# Patient Record
Sex: Female | Born: 1950 | Race: White | Hispanic: No | Marital: Married | Smoking: Never smoker
Health system: Southern US, Community
[De-identification: ages and names within clinical notes are randomized; demographics above are authoritative.]

## PROBLEM LIST (undated history)

## (undated) DIAGNOSIS — R51 Headache: Secondary | ICD-10-CM

## (undated) DIAGNOSIS — C801 Malignant (primary) neoplasm, unspecified: Secondary | ICD-10-CM

## (undated) DIAGNOSIS — K289 Gastrojejunal ulcer, unspecified as acute or chronic, without hemorrhage or perforation: Secondary | ICD-10-CM

## (undated) DIAGNOSIS — Z9221 Personal history of antineoplastic chemotherapy: Secondary | ICD-10-CM

## (undated) DIAGNOSIS — Z923 Personal history of irradiation: Secondary | ICD-10-CM

## (undated) DIAGNOSIS — R112 Nausea with vomiting, unspecified: Secondary | ICD-10-CM

## (undated) DIAGNOSIS — Z9889 Other specified postprocedural states: Secondary | ICD-10-CM

## (undated) DIAGNOSIS — R5383 Other fatigue: Principal | ICD-10-CM

## (undated) DIAGNOSIS — C50919 Malignant neoplasm of unspecified site of unspecified female breast: Secondary | ICD-10-CM

## (undated) DIAGNOSIS — R011 Cardiac murmur, unspecified: Secondary | ICD-10-CM

## (undated) HISTORY — PX: BREAST LUMPECTOMY: SHX2

## (undated) HISTORY — PX: TUBAL LIGATION: SHX77

## (undated) HISTORY — DX: Malignant (primary) neoplasm, unspecified: C80.1

## (undated) HISTORY — PX: SHOULDER ARTHROSCOPY: SHX128

## (undated) HISTORY — DX: Other fatigue: R53.83

## (undated) HISTORY — DX: Malignant neoplasm of unspecified site of unspecified female breast: C50.919

## (undated) HISTORY — PX: APPENDECTOMY: SHX54

## (undated) HISTORY — DX: Gastrojejunal ulcer, unspecified as acute or chronic, without hemorrhage or perforation: K28.9

## (undated) HISTORY — PX: CARPAL TUNNEL RELEASE: SHX101

---

## 1990-12-01 HISTORY — PX: BREAST LUMPECTOMY: SHX2

## 1998-03-01 ENCOUNTER — Encounter: Admission: RE | Admit: 1998-03-01 | Discharge: 1998-05-30 | Payer: Self-pay | Admitting: Oncology

## 1999-10-10 ENCOUNTER — Encounter: Admission: RE | Admit: 1999-10-10 | Discharge: 1999-10-10 | Payer: Self-pay | Admitting: Oncology

## 1999-10-10 ENCOUNTER — Encounter (HOSPITAL_COMMUNITY): Payer: Self-pay | Admitting: Oncology

## 2000-10-12 ENCOUNTER — Encounter (HOSPITAL_COMMUNITY): Payer: Self-pay | Admitting: Oncology

## 2000-10-12 ENCOUNTER — Encounter: Admission: RE | Admit: 2000-10-12 | Discharge: 2000-10-12 | Payer: Self-pay | Admitting: Oncology

## 2001-10-14 ENCOUNTER — Encounter: Payer: Self-pay | Admitting: Internal Medicine

## 2001-10-14 ENCOUNTER — Encounter: Admission: RE | Admit: 2001-10-14 | Discharge: 2001-10-14 | Payer: Self-pay | Admitting: Internal Medicine

## 2001-10-21 ENCOUNTER — Encounter: Payer: Self-pay | Admitting: Internal Medicine

## 2001-10-21 ENCOUNTER — Encounter: Admission: RE | Admit: 2001-10-21 | Discharge: 2001-10-21 | Payer: Self-pay | Admitting: Internal Medicine

## 2002-10-31 ENCOUNTER — Encounter: Payer: Self-pay | Admitting: Internal Medicine

## 2002-10-31 ENCOUNTER — Encounter: Admission: RE | Admit: 2002-10-31 | Discharge: 2002-10-31 | Payer: Self-pay | Admitting: Internal Medicine

## 2003-12-11 ENCOUNTER — Encounter: Admission: RE | Admit: 2003-12-11 | Discharge: 2003-12-11 | Payer: Self-pay | Admitting: Internal Medicine

## 2004-12-26 ENCOUNTER — Encounter: Admission: RE | Admit: 2004-12-26 | Discharge: 2004-12-26 | Payer: Self-pay | Admitting: Internal Medicine

## 2005-12-29 ENCOUNTER — Encounter: Admission: RE | Admit: 2005-12-29 | Discharge: 2005-12-29 | Payer: Self-pay | Admitting: Internal Medicine

## 2006-12-30 ENCOUNTER — Encounter: Admission: RE | Admit: 2006-12-30 | Discharge: 2006-12-30 | Payer: Self-pay | Admitting: Internal Medicine

## 2007-01-11 ENCOUNTER — Encounter: Admission: RE | Admit: 2007-01-11 | Discharge: 2007-01-11 | Payer: Self-pay | Admitting: Internal Medicine

## 2008-02-09 ENCOUNTER — Encounter: Admission: RE | Admit: 2008-02-09 | Discharge: 2008-02-09 | Payer: Self-pay | Admitting: Internal Medicine

## 2009-02-13 ENCOUNTER — Encounter: Admission: RE | Admit: 2009-02-13 | Discharge: 2009-02-13 | Payer: Self-pay | Admitting: Internal Medicine

## 2009-08-24 ENCOUNTER — Encounter: Payer: Self-pay | Admitting: Internal Medicine

## 2009-09-19 ENCOUNTER — Ambulatory Visit: Payer: Self-pay | Admitting: Internal Medicine

## 2009-09-19 DIAGNOSIS — R930 Abnormal findings on diagnostic imaging of skull and head, not elsewhere classified: Secondary | ICD-10-CM | POA: Insufficient documentation

## 2009-09-19 DIAGNOSIS — R079 Chest pain, unspecified: Secondary | ICD-10-CM

## 2009-09-19 DIAGNOSIS — C50511 Malignant neoplasm of lower-outer quadrant of right female breast: Secondary | ICD-10-CM | POA: Insufficient documentation

## 2009-09-19 HISTORY — DX: Chest pain, unspecified: R07.9

## 2009-10-29 ENCOUNTER — Ambulatory Visit: Payer: Self-pay | Admitting: Internal Medicine

## 2010-02-18 ENCOUNTER — Encounter: Admission: RE | Admit: 2010-02-18 | Discharge: 2010-02-18 | Payer: Self-pay | Admitting: Internal Medicine

## 2010-12-01 HISTORY — PX: BREAST LUMPECTOMY: SHX2

## 2010-12-22 ENCOUNTER — Encounter: Payer: Self-pay | Admitting: Internal Medicine

## 2011-03-10 ENCOUNTER — Other Ambulatory Visit: Payer: Self-pay | Admitting: Internal Medicine

## 2011-03-10 DIAGNOSIS — Z1231 Encounter for screening mammogram for malignant neoplasm of breast: Secondary | ICD-10-CM

## 2011-03-12 ENCOUNTER — Ambulatory Visit
Admission: RE | Admit: 2011-03-12 | Discharge: 2011-03-12 | Disposition: A | Discharge status: Home / Self Care | Payer: BC Managed Care – PPO | Source: Ambulatory Visit | Attending: Internal Medicine | Admitting: Internal Medicine

## 2011-03-12 DIAGNOSIS — Z1231 Encounter for screening mammogram for malignant neoplasm of breast: Secondary | ICD-10-CM

## 2011-03-17 ENCOUNTER — Other Ambulatory Visit: Payer: Self-pay | Admitting: Internal Medicine

## 2011-03-17 DIAGNOSIS — R928 Other abnormal and inconclusive findings on diagnostic imaging of breast: Secondary | ICD-10-CM

## 2011-03-20 ENCOUNTER — Other Ambulatory Visit: Payer: BC Managed Care – PPO

## 2011-03-24 ENCOUNTER — Other Ambulatory Visit: Payer: BC Managed Care – PPO

## 2011-03-25 ENCOUNTER — Ambulatory Visit
Admission: RE | Admit: 2011-03-25 | Discharge: 2011-03-25 | Disposition: A | Discharge status: Home / Self Care | Payer: BC Managed Care – PPO | Source: Ambulatory Visit | Attending: Internal Medicine | Admitting: Internal Medicine

## 2011-03-25 ENCOUNTER — Other Ambulatory Visit: Payer: Self-pay | Admitting: Radiology

## 2011-03-25 ENCOUNTER — Other Ambulatory Visit: Payer: Self-pay | Admitting: Internal Medicine

## 2011-03-25 DIAGNOSIS — R928 Other abnormal and inconclusive findings on diagnostic imaging of breast: Secondary | ICD-10-CM

## 2011-03-25 HISTORY — PX: BREAST BIOPSY: SHX20

## 2011-03-26 ENCOUNTER — Other Ambulatory Visit: Payer: Self-pay | Admitting: Internal Medicine

## 2011-03-26 DIAGNOSIS — C50911 Malignant neoplasm of unspecified site of right female breast: Secondary | ICD-10-CM

## 2011-03-31 ENCOUNTER — Ambulatory Visit
Admission: RE | Admit: 2011-03-31 | Discharge: 2011-03-31 | Disposition: A | Discharge status: Home / Self Care | Payer: BC Managed Care – PPO | Source: Ambulatory Visit | Attending: Internal Medicine | Admitting: Internal Medicine

## 2011-03-31 DIAGNOSIS — C50911 Malignant neoplasm of unspecified site of right female breast: Secondary | ICD-10-CM

## 2011-03-31 MED ORDER — GADOBENATE DIMEGLUMINE 529 MG/ML IV SOLN
14.0000 mL | Freq: Once | INTRAVENOUS | Status: AC | PRN
  Administered 2011-03-31: 14 mL via INTRAVENOUS

## 2011-04-02 ENCOUNTER — Other Ambulatory Visit: Payer: Self-pay | Admitting: Oncology

## 2011-04-02 ENCOUNTER — Encounter (HOSPITAL_BASED_OUTPATIENT_CLINIC_OR_DEPARTMENT_OTHER): Payer: BC Managed Care – PPO | Admitting: Oncology

## 2011-04-02 DIAGNOSIS — C50419 Malignant neoplasm of upper-outer quadrant of unspecified female breast: Secondary | ICD-10-CM

## 2011-04-02 LAB — CBC WITH DIFFERENTIAL/PLATELET
BASO%: 0.6 % (ref 0.0–2.0)
Basophils Absolute: 0 10*3/uL (ref 0.0–0.1)
HCT: 36.8 % (ref 34.8–46.6)
HGB: 12.5 g/dL (ref 11.6–15.9)
MCH: 30.4 pg (ref 25.1–34.0)
MCV: 89.3 fL (ref 79.5–101.0)
MONO%: 6.5 % (ref 0.0–14.0)
NEUT#: 3 10*3/uL (ref 1.5–6.5)
Platelets: 209 10*3/uL (ref 145–400)
RBC: 4.12 10*6/uL (ref 3.70–5.45)
RDW: 13.1 % (ref 11.2–14.5)

## 2011-04-02 LAB — COMPREHENSIVE METABOLIC PANEL
ALT: 22 U/L (ref 0–35)
AST: 23 U/L (ref 0–37)
Alkaline Phosphatase: 90 U/L (ref 39–117)
BUN: 16 mg/dL (ref 6–23)
Calcium: 9.6 mg/dL (ref 8.4–10.5)
Creatinine, Ser: 0.87 mg/dL (ref 0.40–1.20)
Sodium: 140 mEq/L (ref 135–145)
Total Protein: 6.8 g/dL (ref 6.0–8.3)

## 2011-04-07 ENCOUNTER — Encounter: Payer: BC Managed Care – PPO | Admitting: Genetic Counselor

## 2011-04-21 ENCOUNTER — Encounter (HOSPITAL_BASED_OUTPATIENT_CLINIC_OR_DEPARTMENT_OTHER): Payer: BC Managed Care – PPO | Admitting: Oncology

## 2011-04-21 DIAGNOSIS — C50419 Malignant neoplasm of upper-outer quadrant of unspecified female breast: Secondary | ICD-10-CM

## 2011-04-22 ENCOUNTER — Other Ambulatory Visit: Payer: Self-pay | Admitting: General Surgery

## 2011-04-22 DIAGNOSIS — C50911 Malignant neoplasm of unspecified site of right female breast: Secondary | ICD-10-CM

## 2011-04-23 ENCOUNTER — Other Ambulatory Visit (HOSPITAL_COMMUNITY): Payer: Self-pay | Admitting: General Surgery

## 2011-04-23 DIAGNOSIS — C50911 Malignant neoplasm of unspecified site of right female breast: Secondary | ICD-10-CM

## 2011-05-02 ENCOUNTER — Other Ambulatory Visit (HOSPITAL_COMMUNITY): Payer: Self-pay | Admitting: General Surgery

## 2011-05-02 ENCOUNTER — Ambulatory Visit (HOSPITAL_COMMUNITY)
Admission: RE | Admit: 2011-05-02 | Discharge: 2011-05-02 | Disposition: A | Discharge status: Home / Self Care | Payer: BC Managed Care – PPO | Source: Ambulatory Visit | Attending: General Surgery | Admitting: General Surgery

## 2011-05-02 ENCOUNTER — Encounter (HOSPITAL_COMMUNITY)
Admission: RE | Admit: 2011-05-02 | Discharge: 2011-05-02 | Disposition: A | Discharge status: Home / Self Care | Payer: BC Managed Care – PPO | Source: Ambulatory Visit | Attending: General Surgery | Admitting: General Surgery

## 2011-05-02 DIAGNOSIS — Z01812 Encounter for preprocedural laboratory examination: Secondary | ICD-10-CM | POA: Insufficient documentation

## 2011-05-02 DIAGNOSIS — C50911 Malignant neoplasm of unspecified site of right female breast: Secondary | ICD-10-CM

## 2011-05-02 DIAGNOSIS — C50919 Malignant neoplasm of unspecified site of unspecified female breast: Secondary | ICD-10-CM | POA: Insufficient documentation

## 2011-05-02 DIAGNOSIS — Z01818 Encounter for other preprocedural examination: Secondary | ICD-10-CM | POA: Insufficient documentation

## 2011-05-02 LAB — CBC
HCT: 39 % (ref 36.0–46.0)
MCHC: 33.8 g/dL (ref 30.0–36.0)
MCV: 89 fL (ref 78.0–100.0)
Platelets: 231 10*3/uL (ref 150–400)
RDW: 12.5 % (ref 11.5–15.5)
WBC: 7.3 10*3/uL (ref 4.0–10.5)

## 2011-05-02 LAB — SURGICAL PCR SCREEN: Staphylococcus aureus: NEGATIVE

## 2011-05-02 LAB — DIFFERENTIAL
Basophils Absolute: 0 10*3/uL (ref 0.0–0.1)
Eosinophils Absolute: 0.1 10*3/uL (ref 0.0–0.7)
Eosinophils Relative: 2 % (ref 0–5)
Lymphs Abs: 1.9 10*3/uL (ref 0.7–4.0)
Monocytes Absolute: 0.5 10*3/uL (ref 0.1–1.0)
Neutrophils Relative %: 65 % (ref 43–77)

## 2011-05-02 LAB — COMPREHENSIVE METABOLIC PANEL
Alkaline Phosphatase: 156 U/L — ABNORMAL HIGH (ref 39–117)
CO2: 28 mEq/L (ref 19–32)
Chloride: 102 mEq/L (ref 96–112)
GFR calc non Af Amer: >60 mL/min (ref >60)
Glucose, Bld: 118 mg/dL — ABNORMAL HIGH (ref 70–99)
Sodium: 138 mEq/L (ref 135–145)
Total Bilirubin: 0.5 mg/dL (ref 0.3–1.2)

## 2011-05-08 ENCOUNTER — Ambulatory Visit
Admission: RE | Admit: 2011-05-08 | Discharge: 2011-05-08 | Disposition: A | Discharge status: Home / Self Care | Payer: BC Managed Care – PPO | Source: Ambulatory Visit | Attending: General Surgery | Admitting: General Surgery

## 2011-05-08 ENCOUNTER — Other Ambulatory Visit: Payer: Self-pay | Admitting: General Surgery

## 2011-05-08 ENCOUNTER — Observation Stay (HOSPITAL_COMMUNITY)
Admission: RE | Admit: 2011-05-08 | Discharge: 2011-05-09 | Disposition: A | Discharge status: Home / Self Care | Payer: BC Managed Care – PPO | Source: Ambulatory Visit | Attending: General Surgery | Admitting: General Surgery

## 2011-05-08 ENCOUNTER — Ambulatory Visit (HOSPITAL_COMMUNITY)
Admission: RE | Admit: 2011-05-08 | Discharge: 2011-05-08 | Disposition: A | Discharge status: Home / Self Care | Payer: BC Managed Care – PPO | Source: Ambulatory Visit | Attending: General Surgery | Admitting: General Surgery

## 2011-05-08 DIAGNOSIS — C50419 Malignant neoplasm of upper-outer quadrant of unspecified female breast: Principal | ICD-10-CM | POA: Insufficient documentation

## 2011-05-08 DIAGNOSIS — C50911 Malignant neoplasm of unspecified site of right female breast: Secondary | ICD-10-CM

## 2011-05-08 MED ORDER — TECHNETIUM TC 99M SULFUR COLLOID FILTERED
1.0000 | Freq: Once | INTRAVENOUS | Status: AC | PRN
  Administered 2011-05-08: 1 via INTRADERMAL

## 2011-05-15 NOTE — Op Note (Signed)
NAME:  Grace Carroll, Grace Carroll                   ACCOUNT NO.:  [id number]  MEDICAL RECORD NO.:  [id number]  LOCATION:  XRAY                         FACILITY:  [facility]  PHYSICIAN:  Adolph Pollack, M.D.DATE OF BIRTH:  May 01, 1951  DATE OF PROCEDURE:  05/07/2011 DATE OF DISCHARGE:  05/02/2011                              OPERATIVE REPORT   PREOPERATIVE DIAGNOSIS:  Right breast cancer.  POSTOPERATIVE DIAGNOSIS:  Right breast cancer.  PROCEDURES: 1. Right breast injection with blue dye and lymphatic mapping. 2. Right axillary sentinel lymph node biopsy. 3. Right partial mastectomy after needle localization.  SURGEON:  Adolph Pollack, MD  ANESTHESIA:  General plus Marcaine local.  INDICATIONS:  Grace Carroll is a 60-year female who 21 years ago had invasive left breast cancer and underwent breast conservation therapy. She underwent a screening mammogram, was noted have an abnormal lesion at 10 o'clock position in the right breast which biopsy was positive for invasive cancer.  She has chosen once again breast conservation therapy and now presents for the above procedures.  TECHNIQUE:  She had successful needle localization and injection of radioactive material in the circumareolar area.  Right breast was marked with my initials in the holding room.  She was brought to the operating, placed supine on the operating table, and general anesthetic was administered.  The bandage of right breast was removed and the wire cut closer to the skin.  The right breast wire and axillary area were sterilely prepped and draped.  Using a NeoProbe, I found an area of increased counts in the lower aspect of the right axilla.  A transverse incision was made in the lower right axillary region through skin and subcutaneous tissue.  Using a NeoProbe, it guided me down to where I was able to identify an enlarged lymph node.  I excised this and looked like a bilobed lymph node with increased counts.  It was not  blue, but it was hot and it was sent to pathology.  I placed a NeoProbe back into the axillary area and no other increased counts were noted.  I then subsequently injected the wound with local anesthetic.  Bleeding was controlled with electrocautery.  Once hemostasis was adequate, I closed the subcutaneous tissue with interrupted 3-0 Vicryl suture.  The skin was closed with 4-0 Monocryl subcuticular stitch.  Next, I approached the wire which was at lateral position of the breast. I made a curvilinear incision in lateral aspect of breast through skin and subcutaneous tissue and brought the wire into the wound.  Using electrocautery, I raised recent flaps in directions.  I then basically did a lumpectomy around the wire down to the chest wall medially and somewhat posteriorly.  Once this tissue was removed, it was and was marked with the wire being lateral, single suture being anterior, double suture being superior.  A specimen mammogram was sent and the area of concern was contained and confirmed by the radiologist.  Following this, I then injected local anesthetic into the wound.  I inspected the wound and hemostasis was adequate.  I irrigated the wound and evacuated the fluid.  I then closed subcutaneous tissue with interrupted 3-0 Vicryl  sutures. The skin was closed with 4-0 Monocryl subcuticular stitch.  Steri-Strips and sterile dressings were applied to both wounds.  She tolerated the procedures well without any apparent complications and was taken to recovery in satisfactory condition.     Adolph Pollack, M.D.     Grace Carroll  D:  05/08/2011  T:  05/09/2011  Job:  [id number]  cc:   Grace Sender., MD Grace Carroll, M.D. Grace Carroll, M.D.  Electronically Signed by Avel Peace M.D. on 05/15/2011 10:33:09 AM

## 2011-05-19 ENCOUNTER — Other Ambulatory Visit: Payer: Self-pay | Admitting: Oncology

## 2011-05-19 ENCOUNTER — Encounter (HOSPITAL_BASED_OUTPATIENT_CLINIC_OR_DEPARTMENT_OTHER): Payer: BC Managed Care – PPO | Admitting: Oncology

## 2011-05-19 DIAGNOSIS — G4700 Insomnia, unspecified: Secondary | ICD-10-CM

## 2011-05-19 DIAGNOSIS — Z170 Estrogen receptor positive status [ER+]: Secondary | ICD-10-CM

## 2011-05-19 DIAGNOSIS — C50419 Malignant neoplasm of upper-outer quadrant of unspecified female breast: Secondary | ICD-10-CM

## 2011-05-19 DIAGNOSIS — F411 Generalized anxiety disorder: Secondary | ICD-10-CM

## 2011-05-19 LAB — CBC WITH DIFFERENTIAL/PLATELET
BASO%: 0.9 % (ref 0.0–2.0)
Basophils Absolute: 0.1 10*3/uL (ref 0.0–0.1)
EOS%: 5.1 % (ref 0.0–7.0)
HCT: 38.4 % (ref 34.8–46.6)
HGB: 13 g/dL (ref 11.6–15.9)
LYMPH%: 32.1 % (ref 14.0–49.7)
MCH: 30.2 pg (ref 25.1–34.0)
MCHC: 34 g/dL (ref 31.5–36.0)
MCV: 88.9 fL (ref 79.5–101.0)
NEUT%: 55.5 % (ref 38.4–76.8)
Platelets: 222 10*3/uL (ref 145–400)

## 2011-05-19 LAB — COMPREHENSIVE METABOLIC PANEL
ALT: 17 U/L (ref 0–35)
AST: 17 U/L (ref 0–37)
BUN: 15 mg/dL (ref 6–23)
Calcium: 9.4 mg/dL (ref 8.4–10.5)
Chloride: 101 mEq/L (ref 96–112)
Creatinine, Ser: 0.81 mg/dL (ref 0.50–1.10)
Total Bilirubin: 0.5 mg/dL (ref 0.3–1.2)

## 2011-05-28 ENCOUNTER — Encounter (INDEPENDENT_AMBULATORY_CARE_PROVIDER_SITE_OTHER): Payer: Self-pay | Admitting: General Surgery

## 2011-05-28 ENCOUNTER — Ambulatory Visit (INDEPENDENT_AMBULATORY_CARE_PROVIDER_SITE_OTHER): Payer: BC Managed Care – PPO | Admitting: General Surgery

## 2011-05-28 DIAGNOSIS — C773 Secondary and unspecified malignant neoplasm of axilla and upper limb lymph nodes: Secondary | ICD-10-CM

## 2011-05-28 DIAGNOSIS — C50919 Malignant neoplasm of unspecified site of unspecified female breast: Secondary | ICD-10-CM

## 2011-05-28 NOTE — Progress Notes (Signed)
Subjective:     Patient ID: Grace Carroll, female   DOB: 1951-10-14, 60 y.o.   MRN: [id number]    There were no vitals taken for this visit.    HPI She is here for postoperative visit following her right breast lumpectomy and sentinel lymph node biopsy. A sentinel lymph node was positive for metastatic carcinoma. Pathology is T2 N1, estrogen receptor positive. The inferior margin is very close. She was presented at breast cancer conference, it was recommended that she have reexcision. She also is going to need chemotherapy, thus she needs long-term venous access. She is sore at the incision site.  Review of Systems     Objective:   Physical Exam Right breast: Incision is clean and intact. A soft seroma is present. Right axilla incision clean and intact. There is hyperemia around the nipple.    Assessment:     T2N1 right breast cancer. Close inferior margin.    Plan:     Reexcision of right breast lumpectomy site. Insertion of Port-A-Cath. Procedure and risks have been discussed with her. Risks include but not limited to bleeding, infection, anesthesia, wound healing problems, pneumothorax, catheter malfunction, DVT. She seems to understand this and agrees with the plan.

## 2011-05-29 ENCOUNTER — Encounter (INDEPENDENT_AMBULATORY_CARE_PROVIDER_SITE_OTHER): Payer: BC Managed Care – PPO | Admitting: General Surgery

## 2011-06-03 ENCOUNTER — Other Ambulatory Visit (INDEPENDENT_AMBULATORY_CARE_PROVIDER_SITE_OTHER): Payer: Self-pay | Admitting: General Surgery

## 2011-06-03 ENCOUNTER — Encounter (HOSPITAL_COMMUNITY): Payer: BC Managed Care – PPO

## 2011-06-03 ENCOUNTER — Encounter (HOSPITAL_BASED_OUTPATIENT_CLINIC_OR_DEPARTMENT_OTHER): Payer: BC Managed Care – PPO | Admitting: Oncology

## 2011-06-03 DIAGNOSIS — C50419 Malignant neoplasm of upper-outer quadrant of unspecified female breast: Secondary | ICD-10-CM

## 2011-06-03 DIAGNOSIS — Z170 Estrogen receptor positive status [ER+]: Secondary | ICD-10-CM

## 2011-06-03 LAB — SURGICAL PCR SCREEN: Staphylococcus aureus: NEGATIVE

## 2011-06-03 LAB — CBC
HCT: 40.8 % (ref 36.0–46.0)
Hemoglobin: 13.5 g/dL (ref 12.0–15.0)
MCHC: 33.1 g/dL (ref 30.0–36.0)
MCV: 87.9 fL (ref 78.0–100.0)
RDW: 12.5 % (ref 11.5–15.5)
WBC: 5.3 10*3/uL (ref 4.0–10.5)

## 2011-06-09 ENCOUNTER — Ambulatory Visit (HOSPITAL_COMMUNITY): Payer: BC Managed Care – PPO

## 2011-06-09 ENCOUNTER — Other Ambulatory Visit (INDEPENDENT_AMBULATORY_CARE_PROVIDER_SITE_OTHER): Payer: Self-pay | Admitting: General Surgery

## 2011-06-09 ENCOUNTER — Ambulatory Visit (HOSPITAL_COMMUNITY)
Admission: RE | Admit: 2011-06-09 | Discharge: 2011-06-09 | Disposition: A | Discharge status: Home / Self Care | Payer: BC Managed Care – PPO | Source: Ambulatory Visit | Attending: General Surgery | Admitting: General Surgery

## 2011-06-09 DIAGNOSIS — C50919 Malignant neoplasm of unspecified site of unspecified female breast: Secondary | ICD-10-CM | POA: Insufficient documentation

## 2011-06-09 DIAGNOSIS — N6039 Fibrosclerosis of unspecified breast: Secondary | ICD-10-CM | POA: Insufficient documentation

## 2011-06-09 DIAGNOSIS — Z01812 Encounter for preprocedural laboratory examination: Secondary | ICD-10-CM | POA: Insufficient documentation

## 2011-06-09 DIAGNOSIS — K219 Gastro-esophageal reflux disease without esophagitis: Secondary | ICD-10-CM | POA: Insufficient documentation

## 2011-06-09 LAB — GLUCOSE, CAPILLARY: Glucose-Capillary: 97 mg/dL (ref 70–99)

## 2011-06-17 NOTE — Op Note (Signed)
NAME:  Grace Carroll, MESSENGER                   ACCOUNT NO.:  [id number]  MEDICAL RECORD NO.:  [id number]  LOCATION:  DAYL                         FACILITY:  [facility]  PHYSICIAN:  Adolph Pollack, M.D.DATE OF BIRTH:  1951/11/14  DATE OF PROCEDURE:  06/09/2011 DATE OF DISCHARGE:  06/09/2011                              OPERATIVE REPORT   PREOPERATIVE DIAGNOSIS:  Right breast cancer with close inferior margin.  POSTOPERATIVE DIAGNOSIS:  Right breast cancer with close inferior margin.  PROCEDURES: 1. Ultrasound-guided Port-A-Cath insertion with fluoroscopy. 2. Re-excision of right breast lumpectomy site.  SURGEON:  Adolph Pollack, M.D.  ANESTHESIA:  General plus Marcaine local.  INDICATIONS:  Grace Carroll is a 60 year old female who underwent a right lumpectomy after needle localization as well as right axillary sentinel lymph node biopsy.  On the right lumpectomy, the inferior margin was very close.  She also was going to need some chemotherapy.  She thus presents for the above procedures.  TECHNIQUE:  She was seen in the holding area and the right breast marked with my initials.  She was then brought to the operating room, placed supine on the operating room table and general anesthetic was administered.  The chest wall neck was sterilely prepped and draped.  A roll was placed under her back.  Her head was tilted to the left. Using ultrasound, I identified the right internal jugular vein.  A small incision was made in the neck and a 16-gauge needle initially placed into right internal jugular but went through the back wall into the carotid artery and I retracted this and held pressure.  Subsequently I was able to cannulate the right internal jugular vein under ultrasound guidance and thread the wire into the superior vena cava.  I verified its position by way of fluoroscopy.  I then went through a previous Port- A-Cath incision site in the right chest wall.  I anesthetized this  area and reincised the scar and created a pocket for the port using electrocautery.  The catheter was then tunneled from the inferior incision up to the neck incision.  A dilator introducer complex was placed over the wire and position verified by fluoroscopy.  The dilator and wire were removed and the catheter was threaded through the peel- away sheath introducer.  Under fluoroscopy, I pulled the catheter back to the tip in the distal superior vena cava.  I subsequently then cut the catheter and attached the port at the chest wall incision.  The port was then attached to the chest wall with interrupted 2-0 Vicryl sutures. The port aspirated blood and flushed easily and I put concentrated heparin solution in the port.  Hemostasis was adequate.  I closed the subcutaneous tissue over the port with a running 2-0 Vicryl suture.  The skin of both incisions was closed with 4-0 Monocryl subcuticular stitch.  Steri-Strips and sterile dressing were applied.  Next, I approached the right lateral breast where I reincised the previous lumpectomy scar.  I went through subcutaneous tissue as well going through the sutures.  I then identified the inferior margin area and using electrocautery I excised the specimen off an inferior margin tissue and sent  this to Pathology.  I then controlled bleeding with electrocautery.  Once hemostasis was adequate, I irrigated the wound and evacuated the solution by way of suction.  I then approximated the subcutaneous tissues with 3-0 Vicryl suture.  The skin was closed with 4- 0 Monocryl subcuticular stitch.  Steri-Strips and sterile dressing were applied.  She tolerated the procedure well without any apparent complications and was taken to recovery room in satisfactory condition where portable chest x-ray is pending.     Adolph Pollack, M.D.     Kari Baars  D:  06/09/2011  T:  06/09/2011  Job:  [id number]  cc:   Drue Second, M.D. Fax:  [phone]  Maryln Gottron, M.D. Fax: [phone]  Wannetta Sender., MD Fax: [phone]  Electronically Signed by Avel Peace M.D. on 06/17/2011 09:49:26 AM

## 2011-06-23 ENCOUNTER — Telehealth (INDEPENDENT_AMBULATORY_CARE_PROVIDER_SITE_OTHER): Payer: Self-pay

## 2011-06-23 NOTE — Telephone Encounter (Deleted)
Pt called this morning c/o of a "sloshing" sound in her breast.  She has no pain or fever.  I explained that this can happen after surgery, and usually the body will absorb the excess fluid over time.  I told her if she became uncomfortable to let us know.  Pt agreed.

## 2011-06-26 ENCOUNTER — Other Ambulatory Visit: Payer: Self-pay | Admitting: Oncology

## 2011-06-26 ENCOUNTER — Encounter (HOSPITAL_BASED_OUTPATIENT_CLINIC_OR_DEPARTMENT_OTHER): Payer: BC Managed Care – PPO | Admitting: Oncology

## 2011-06-26 DIAGNOSIS — C50419 Malignant neoplasm of upper-outer quadrant of unspecified female breast: Secondary | ICD-10-CM

## 2011-06-26 DIAGNOSIS — Z5111 Encounter for antineoplastic chemotherapy: Secondary | ICD-10-CM

## 2011-06-26 LAB — COMPREHENSIVE METABOLIC PANEL
ALT: 40 U/L — ABNORMAL HIGH (ref 0–35)
AST: 30 U/L (ref 0–37)
Albumin: 4.3 g/dL (ref 3.5–5.2)
CO2: 23 mEq/L (ref 19–32)
Calcium: 9.4 mg/dL (ref 8.4–10.5)
Chloride: 103 mEq/L (ref 96–112)
Creatinine, Ser: 0.86 mg/dL (ref 0.50–1.10)
Potassium: 4.1 mEq/L (ref 3.5–5.3)
Sodium: 141 mEq/L (ref 135–145)
Total Protein: 6.3 g/dL (ref 6.0–8.3)

## 2011-06-26 LAB — CBC WITH DIFFERENTIAL/PLATELET
BASO%: 0.7 % (ref 0.0–2.0)
Eosinophils Absolute: 0.2 10*3/uL (ref 0.0–0.5)
MCHC: 33.9 g/dL (ref 31.5–36.0)
MONO#: 0.5 10*3/uL (ref 0.1–0.9)
NEUT#: 4 10*3/uL (ref 1.5–6.5)
RBC: 4.26 10*6/uL (ref 3.70–5.45)
RDW: 13 % (ref 11.2–14.5)
WBC: 6.8 10*3/uL (ref 3.9–10.3)
lymph#: 2 10*3/uL (ref 0.9–3.3)
nRBC: 0 % (ref 0–0)

## 2011-06-27 ENCOUNTER — Encounter (HOSPITAL_BASED_OUTPATIENT_CLINIC_OR_DEPARTMENT_OTHER): Payer: BC Managed Care – PPO | Admitting: Oncology

## 2011-07-02 ENCOUNTER — Other Ambulatory Visit: Payer: Self-pay | Admitting: Oncology

## 2011-07-02 ENCOUNTER — Encounter (HOSPITAL_BASED_OUTPATIENT_CLINIC_OR_DEPARTMENT_OTHER): Payer: BC Managed Care – PPO

## 2011-07-02 DIAGNOSIS — C50419 Malignant neoplasm of upper-outer quadrant of unspecified female breast: Secondary | ICD-10-CM

## 2011-07-02 DIAGNOSIS — Z170 Estrogen receptor positive status [ER+]: Secondary | ICD-10-CM

## 2011-07-02 LAB — CBC WITH DIFFERENTIAL (CANCER CENTER ONLY)
BASO%: 0.9 % (ref 0.0–2.0)
EOS%: 4.7 % (ref 0.0–7.0)
HCT: 38.4 % (ref 34.8–46.6)
LYMPH#: 1.2 10*3/uL (ref 0.9–3.3)
MCHC: 34.9 g/dL (ref 32.0–36.0)
NEUT%: 39.7 % (ref 39.6–80.0)
Platelets: 178 10*3/uL (ref 145–400)
RDW: 12.5 % (ref 11.1–15.7)

## 2011-07-02 LAB — BASIC METABOLIC PANEL
Calcium: 10 mg/dL (ref 8.4–10.5)
Chloride: 96 mEq/L (ref 96–112)
Creatinine, Ser: 0.89 mg/dL (ref 0.50–1.10)
Sodium: 136 mEq/L (ref 135–145)

## 2011-07-04 ENCOUNTER — Encounter (INDEPENDENT_AMBULATORY_CARE_PROVIDER_SITE_OTHER): Payer: Self-pay | Admitting: General Surgery

## 2011-07-04 ENCOUNTER — Ambulatory Visit (INDEPENDENT_AMBULATORY_CARE_PROVIDER_SITE_OTHER): Payer: BC Managed Care – PPO | Admitting: General Surgery

## 2011-07-04 DIAGNOSIS — C50919 Malignant neoplasm of unspecified site of unspecified female breast: Secondary | ICD-10-CM

## 2011-07-04 NOTE — Progress Notes (Signed)
Operation:  Right partial mastectomy, right axillary sentinel lymph node biopsy. Reexcision right partial mastectomy site, Port-A-Cath insertion.  Date:  May 07, 2011. Second operation June 09, 2011.  Pathology: T2 N1a  HPI:  She is here for a postoperative visit after the above procedures. She is tolerating her chemotherapy fairly well. No problems with her incisions.   Physical Exam: Right breast-upper outer quadrant incision is clean, intact, and solid. Right chest wall Port-A-Cath incision is clean. Right neck incision is clean. Right axillary incision is clean, intact.   Assessment:  T2 N1 right breast cancer-wounds healing well.  Plan:  Return visit in 3-4 months.

## 2011-07-17 ENCOUNTER — Other Ambulatory Visit: Payer: Self-pay | Admitting: Oncology

## 2011-07-17 ENCOUNTER — Encounter (HOSPITAL_BASED_OUTPATIENT_CLINIC_OR_DEPARTMENT_OTHER): Payer: BC Managed Care – PPO | Admitting: Oncology

## 2011-07-17 DIAGNOSIS — Z5111 Encounter for antineoplastic chemotherapy: Secondary | ICD-10-CM

## 2011-07-17 DIAGNOSIS — C50419 Malignant neoplasm of upper-outer quadrant of unspecified female breast: Secondary | ICD-10-CM

## 2011-07-17 DIAGNOSIS — Z170 Estrogen receptor positive status [ER+]: Secondary | ICD-10-CM

## 2011-07-17 LAB — CBC WITH DIFFERENTIAL/PLATELET
Basophils Absolute: 0.1 10*3/uL (ref 0.0–0.1)
HCT: 35.1 % (ref 34.8–46.6)
HGB: 11.8 g/dL (ref 11.6–15.9)
LYMPH%: 23.5 % (ref 14.0–49.7)
MCH: 29.8 pg (ref 25.1–34.0)
MONO#: 0.5 10*3/uL (ref 0.1–0.9)
NEUT%: 64.3 % (ref 38.4–76.8)
Platelets: 281 10*3/uL (ref 145–400)
WBC: 5.6 10*3/uL (ref 3.9–10.3)
lymph#: 1.3 10*3/uL (ref 0.9–3.3)

## 2011-07-17 LAB — COMPREHENSIVE METABOLIC PANEL
Albumin: 4.2 g/dL (ref 3.5–5.2)
BUN: 18 mg/dL (ref 6–23)
Calcium: 9.4 mg/dL (ref 8.4–10.5)
Chloride: 105 mEq/L (ref 96–112)
Creatinine, Ser: 1.01 mg/dL (ref 0.50–1.10)
Glucose, Bld: 82 mg/dL (ref 70–99)
Potassium: 4.3 mEq/L (ref 3.5–5.3)

## 2011-07-18 ENCOUNTER — Encounter (HOSPITAL_BASED_OUTPATIENT_CLINIC_OR_DEPARTMENT_OTHER): Payer: BC Managed Care – PPO | Admitting: Oncology

## 2011-07-18 DIAGNOSIS — C50419 Malignant neoplasm of upper-outer quadrant of unspecified female breast: Secondary | ICD-10-CM

## 2011-07-18 DIAGNOSIS — Z5189 Encounter for other specified aftercare: Secondary | ICD-10-CM

## 2011-07-24 NOTE — Telephone Encounter (Signed)
Message unavailable.

## 2011-07-25 ENCOUNTER — Encounter (HOSPITAL_BASED_OUTPATIENT_CLINIC_OR_DEPARTMENT_OTHER): Payer: BC Managed Care – PPO | Admitting: Oncology

## 2011-07-25 ENCOUNTER — Other Ambulatory Visit: Payer: Self-pay | Admitting: Oncology

## 2011-07-25 DIAGNOSIS — C50419 Malignant neoplasm of upper-outer quadrant of unspecified female breast: Secondary | ICD-10-CM

## 2011-07-25 DIAGNOSIS — Z170 Estrogen receptor positive status [ER+]: Secondary | ICD-10-CM

## 2011-07-25 DIAGNOSIS — E860 Dehydration: Secondary | ICD-10-CM

## 2011-07-25 LAB — CBC WITH DIFFERENTIAL/PLATELET
Basophils Absolute: 0 10*3/uL (ref 0.0–0.1)
EOS%: 0.7 % (ref 0.0–7.0)
Eosinophils Absolute: 0.1 10*3/uL (ref 0.0–0.5)
HGB: 12.2 g/dL (ref 11.6–15.9)
MONO#: 0.5 10*3/uL (ref 0.1–0.9)
NEUT#: 11.3 10*3/uL — ABNORMAL HIGH (ref 1.5–6.5)
RDW: 14.3 % (ref 11.2–14.5)
WBC: 13.7 10*3/uL — ABNORMAL HIGH (ref 3.9–10.3)
lymph#: 1.8 10*3/uL (ref 0.9–3.3)

## 2011-07-25 LAB — BASIC METABOLIC PANEL
BUN: 13 mg/dL (ref 6–23)
CO2: 27 mEq/L (ref 19–32)
Chloride: 99 mEq/L (ref 96–112)
Potassium: 4.5 mEq/L (ref 3.5–5.3)

## 2011-07-26 ENCOUNTER — Encounter (HOSPITAL_BASED_OUTPATIENT_CLINIC_OR_DEPARTMENT_OTHER): Payer: BC Managed Care – PPO | Admitting: Oncology

## 2011-07-26 DIAGNOSIS — Z170 Estrogen receptor positive status [ER+]: Secondary | ICD-10-CM

## 2011-07-26 DIAGNOSIS — E860 Dehydration: Secondary | ICD-10-CM

## 2011-08-07 ENCOUNTER — Encounter (HOSPITAL_BASED_OUTPATIENT_CLINIC_OR_DEPARTMENT_OTHER): Payer: BC Managed Care – PPO | Admitting: Oncology

## 2011-08-07 ENCOUNTER — Other Ambulatory Visit: Payer: Self-pay | Admitting: Oncology

## 2011-08-07 DIAGNOSIS — Z5111 Encounter for antineoplastic chemotherapy: Secondary | ICD-10-CM

## 2011-08-07 DIAGNOSIS — C50419 Malignant neoplasm of upper-outer quadrant of unspecified female breast: Secondary | ICD-10-CM

## 2011-08-07 DIAGNOSIS — Z170 Estrogen receptor positive status [ER+]: Secondary | ICD-10-CM

## 2011-08-07 LAB — CBC WITH DIFFERENTIAL/PLATELET
Basophils Absolute: 0 10*3/uL (ref 0.0–0.1)
Eosinophils Absolute: 0 10*3/uL (ref 0.0–0.5)
LYMPH%: 6.5 % — ABNORMAL LOW (ref 14.0–49.7)
MCV: 89.2 fL (ref 79.5–101.0)
MONO%: 1.5 % (ref 0.0–14.0)
NEUT#: 8.9 10*3/uL — ABNORMAL HIGH (ref 1.5–6.5)
Platelets: 253 10*3/uL (ref 145–400)
RBC: 4.06 10*6/uL (ref 3.70–5.45)
nRBC: 0 % (ref 0–0)

## 2011-08-07 LAB — COMPREHENSIVE METABOLIC PANEL
CO2: 21 mEq/L (ref 19–32)
Creatinine, Ser: 0.8 mg/dL (ref 0.50–1.10)
Glucose, Bld: 189 mg/dL — ABNORMAL HIGH (ref 70–99)
Total Bilirubin: 0.5 mg/dL (ref 0.3–1.2)

## 2011-08-08 ENCOUNTER — Encounter (HOSPITAL_BASED_OUTPATIENT_CLINIC_OR_DEPARTMENT_OTHER): Payer: BC Managed Care – PPO | Admitting: Oncology

## 2011-08-08 DIAGNOSIS — Z5189 Encounter for other specified aftercare: Secondary | ICD-10-CM

## 2011-08-08 DIAGNOSIS — C50419 Malignant neoplasm of upper-outer quadrant of unspecified female breast: Secondary | ICD-10-CM

## 2011-08-14 ENCOUNTER — Encounter (HOSPITAL_COMMUNITY): Payer: BC Managed Care – PPO | Attending: Oncology

## 2011-08-14 ENCOUNTER — Other Ambulatory Visit: Payer: Self-pay | Admitting: Oncology

## 2011-08-14 ENCOUNTER — Encounter (HOSPITAL_BASED_OUTPATIENT_CLINIC_OR_DEPARTMENT_OTHER): Payer: BC Managed Care – PPO | Admitting: Oncology

## 2011-08-14 DIAGNOSIS — C50419 Malignant neoplasm of upper-outer quadrant of unspecified female breast: Secondary | ICD-10-CM

## 2011-08-14 DIAGNOSIS — G4700 Insomnia, unspecified: Secondary | ICD-10-CM

## 2011-08-14 DIAGNOSIS — F411 Generalized anxiety disorder: Secondary | ICD-10-CM

## 2011-08-14 DIAGNOSIS — R112 Nausea with vomiting, unspecified: Secondary | ICD-10-CM

## 2011-08-14 DIAGNOSIS — C50919 Malignant neoplasm of unspecified site of unspecified female breast: Secondary | ICD-10-CM | POA: Insufficient documentation

## 2011-08-14 LAB — CBC WITH DIFFERENTIAL/PLATELET
Basophils Absolute: 0 10*3/uL (ref 0.0–0.1)
EOS%: 1.9 % (ref 0.0–7.0)
HGB: 11.9 g/dL (ref 11.6–15.9)
MCH: 31.3 pg (ref 25.1–34.0)
MCV: 90.9 fL (ref 79.5–101.0)
MONO%: 11.4 % (ref 0.0–14.0)
RDW: 15.4 % — ABNORMAL HIGH (ref 11.2–14.5)

## 2011-08-14 LAB — BASIC METABOLIC PANEL
BUN: 10 mg/dL (ref 6–23)
Creatinine, Ser: 1 mg/dL (ref 0.50–1.10)
Potassium: 4.6 mEq/L (ref 3.5–5.3)

## 2011-08-15 ENCOUNTER — Encounter (HOSPITAL_COMMUNITY): Payer: BC Managed Care – PPO

## 2011-08-21 ENCOUNTER — Ambulatory Visit
Admission: RE | Admit: 2011-08-21 | Discharge: 2011-08-21 | Disposition: A | Discharge status: Home / Self Care | Payer: BC Managed Care – PPO | Source: Ambulatory Visit | Attending: Radiation Oncology | Admitting: Radiation Oncology

## 2011-08-21 DIAGNOSIS — C50919 Malignant neoplasm of unspecified site of unspecified female breast: Secondary | ICD-10-CM | POA: Insufficient documentation

## 2011-08-21 DIAGNOSIS — Z170 Estrogen receptor positive status [ER+]: Secondary | ICD-10-CM | POA: Insufficient documentation

## 2011-08-21 DIAGNOSIS — C773 Secondary and unspecified malignant neoplasm of axilla and upper limb lymph nodes: Secondary | ICD-10-CM | POA: Insufficient documentation

## 2011-08-21 DIAGNOSIS — Z510 Encounter for antineoplastic radiation therapy: Secondary | ICD-10-CM | POA: Insufficient documentation

## 2011-08-22 ENCOUNTER — Other Ambulatory Visit: Payer: Self-pay | Admitting: Oncology

## 2011-08-22 ENCOUNTER — Encounter (HOSPITAL_BASED_OUTPATIENT_CLINIC_OR_DEPARTMENT_OTHER): Payer: BC Managed Care – PPO | Admitting: Oncology

## 2011-08-22 DIAGNOSIS — F411 Generalized anxiety disorder: Secondary | ICD-10-CM

## 2011-08-22 DIAGNOSIS — G4700 Insomnia, unspecified: Secondary | ICD-10-CM

## 2011-08-22 DIAGNOSIS — Z170 Estrogen receptor positive status [ER+]: Secondary | ICD-10-CM

## 2011-08-22 DIAGNOSIS — C50419 Malignant neoplasm of upper-outer quadrant of unspecified female breast: Secondary | ICD-10-CM

## 2011-08-22 LAB — CBC WITH DIFFERENTIAL/PLATELET
Eosinophils Absolute: 0 10*3/uL (ref 0.0–0.5)
MONO#: 0.5 10*3/uL (ref 0.1–0.9)
NEUT#: 16.3 10*3/uL — ABNORMAL HIGH (ref 1.5–6.5)
Platelets: 185 10*3/uL (ref 145–400)
RBC: 4.17 10*6/uL (ref 3.70–5.45)
RDW: 16.3 % — ABNORMAL HIGH (ref 11.2–14.5)
WBC: 18.5 10*3/uL — ABNORMAL HIGH (ref 3.9–10.3)
lymph#: 1.6 10*3/uL (ref 0.9–3.3)

## 2011-08-22 LAB — BASIC METABOLIC PANEL
CO2: 25 mEq/L (ref 19–32)
Chloride: 101 mEq/L (ref 96–112)
Glucose, Bld: 98 mg/dL (ref 70–99)
Potassium: 3.9 mEq/L (ref 3.5–5.3)
Sodium: 139 mEq/L (ref 135–145)

## 2011-08-28 ENCOUNTER — Other Ambulatory Visit: Payer: Self-pay | Admitting: Oncology

## 2011-08-28 ENCOUNTER — Encounter (HOSPITAL_BASED_OUTPATIENT_CLINIC_OR_DEPARTMENT_OTHER): Payer: BC Managed Care – PPO | Admitting: Oncology

## 2011-08-28 DIAGNOSIS — C50419 Malignant neoplasm of upper-outer quadrant of unspecified female breast: Secondary | ICD-10-CM

## 2011-08-28 DIAGNOSIS — Z5111 Encounter for antineoplastic chemotherapy: Secondary | ICD-10-CM

## 2011-08-28 LAB — CBC WITH DIFFERENTIAL/PLATELET
Basophils Absolute: 0 10*3/uL (ref 0.0–0.1)
Eosinophils Absolute: 0 10*3/uL (ref 0.0–0.5)
HGB: 12.9 g/dL (ref 11.6–15.9)
MONO#: 0.3 10*3/uL (ref 0.1–0.9)
NEUT#: 13.4 10*3/uL — ABNORMAL HIGH (ref 1.5–6.5)
RBC: 4.26 10*6/uL (ref 3.70–5.45)
RDW: 15.6 % — ABNORMAL HIGH (ref 11.2–14.5)
WBC: 14.3 10*3/uL — ABNORMAL HIGH (ref 3.9–10.3)
nRBC: 0 % (ref 0–0)

## 2011-08-28 LAB — COMPREHENSIVE METABOLIC PANEL
ALT: 12 U/L (ref 0–35)
Albumin: 4 g/dL (ref 3.5–5.2)
CO2: 23 mEq/L (ref 19–32)
Calcium: 8.9 mg/dL (ref 8.4–10.5)
Chloride: 103 mEq/L (ref 96–112)
Glucose, Bld: 155 mg/dL — ABNORMAL HIGH (ref 70–99)
Potassium: 4.5 mEq/L (ref 3.5–5.3)
Sodium: 140 mEq/L (ref 135–145)
Total Protein: 6.1 g/dL (ref 6.0–8.3)

## 2011-09-04 ENCOUNTER — Other Ambulatory Visit: Payer: Self-pay | Admitting: Oncology

## 2011-09-04 ENCOUNTER — Encounter (HOSPITAL_BASED_OUTPATIENT_CLINIC_OR_DEPARTMENT_OTHER): Payer: BC Managed Care – PPO | Admitting: Oncology

## 2011-09-04 DIAGNOSIS — C50419 Malignant neoplasm of upper-outer quadrant of unspecified female breast: Secondary | ICD-10-CM

## 2011-09-04 DIAGNOSIS — Z170 Estrogen receptor positive status [ER+]: Secondary | ICD-10-CM

## 2011-09-04 DIAGNOSIS — D72819 Decreased white blood cell count, unspecified: Secondary | ICD-10-CM

## 2011-09-04 DIAGNOSIS — I959 Hypotension, unspecified: Secondary | ICD-10-CM

## 2011-09-04 LAB — MANUAL DIFFERENTIAL
ALC: 0.3 10*3/uL — ABNORMAL LOW (ref 0.9–3.3)
ANC (CHCC manual diff): 0.5 10*3/uL — ABNORMAL LOW (ref 1.5–6.5)
EOS: 6 % (ref 0–7)
PLT EST: ADEQUATE
SEG: 46 % (ref 38–77)

## 2011-09-04 LAB — BASIC METABOLIC PANEL
CO2: 24 mEq/L (ref 19–32)
Calcium: 8.7 mg/dL (ref 8.4–10.5)
Potassium: 4.1 mEq/L (ref 3.5–5.3)
Sodium: 137 mEq/L (ref 135–145)

## 2011-09-04 LAB — CBC WITH DIFFERENTIAL/PLATELET
MCV: 89.4 fL (ref 79.5–101.0)
RBC: 3.77 10*6/uL (ref 3.70–5.45)
RDW: 14.6 % — ABNORMAL HIGH (ref 11.2–14.5)
WBC: 0.9 10*3/uL — CL (ref 3.9–10.3)

## 2011-09-05 ENCOUNTER — Encounter (HOSPITAL_BASED_OUTPATIENT_CLINIC_OR_DEPARTMENT_OTHER): Payer: BC Managed Care – PPO | Admitting: Oncology

## 2011-09-05 DIAGNOSIS — C50419 Malignant neoplasm of upper-outer quadrant of unspecified female breast: Secondary | ICD-10-CM

## 2011-09-05 DIAGNOSIS — I959 Hypotension, unspecified: Secondary | ICD-10-CM

## 2011-09-29 ENCOUNTER — Encounter (INDEPENDENT_AMBULATORY_CARE_PROVIDER_SITE_OTHER): Payer: Self-pay | Admitting: General Surgery

## 2011-09-30 ENCOUNTER — Encounter: Payer: Self-pay | Admitting: *Deleted

## 2011-10-06 ENCOUNTER — Ambulatory Visit
Admission: RE | Admit: 2011-10-06 | Discharge: 2011-10-06 | Disposition: A | Discharge status: Home / Self Care | Payer: BC Managed Care – PPO | Source: Ambulatory Visit | Attending: Radiation Oncology | Admitting: Radiation Oncology

## 2011-10-06 DIAGNOSIS — C50919 Malignant neoplasm of unspecified site of unspecified female breast: Secondary | ICD-10-CM

## 2011-10-06 NOTE — Progress Notes (Signed)
[facility] Rad[facility]cology Weekly Treatment Note    Name: Grace Carroll Date: 10/06/2011 MRN: [id number] DOB: 11-17-1951  Status:outpatient    Current dose: 3600cGy  Current fraction:20  Planned dose:6040cGy  Planned fraction:33   MEDICATIONS:@MEDADMINPROSE @   ALLERGIES: Codeine   LABORATORY DATA:     NARRATIVE: Grace Carroll was seen today for weekly treatment management. The chart was checked and port films images were reviewed. No complaints today except for mild L breast discomfort. Using R P Gel BID.  PHYSICAL EXAMINATION: vitals were not taken for this visit.       Wt. 159.5 Moderate erythema of breast. No desquamation.    ASSESSMENT: Patient tolerating treatments well. 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   PLAN: Continue treatment as planned. Continue with R P Gel. May use ibuprofen prn.

## 2011-10-07 ENCOUNTER — Ambulatory Visit
Admission: RE | Admit: 2011-10-07 | Discharge: 2011-10-07 | Disposition: A | Discharge status: Home / Self Care | Payer: BC Managed Care – PPO | Source: Ambulatory Visit | Attending: Radiation Oncology | Admitting: Radiation Oncology

## 2011-10-08 ENCOUNTER — Ambulatory Visit
Admission: RE | Admit: 2011-10-08 | Discharge: 2011-10-08 | Disposition: A | Discharge status: Home / Self Care | Payer: BC Managed Care – PPO | Source: Ambulatory Visit | Attending: Radiation Oncology | Admitting: Radiation Oncology

## 2011-10-09 ENCOUNTER — Ambulatory Visit
Admission: RE | Admit: 2011-10-09 | Discharge: 2011-10-09 | Disposition: A | Discharge status: Home / Self Care | Payer: BC Managed Care – PPO | Source: Ambulatory Visit | Attending: Radiation Oncology | Admitting: Radiation Oncology

## 2011-10-10 ENCOUNTER — Ambulatory Visit
Admission: RE | Admit: 2011-10-10 | Discharge: 2011-10-10 | Disposition: A | Discharge status: Home / Self Care | Payer: BC Managed Care – PPO | Source: Ambulatory Visit | Attending: Radiation Oncology | Admitting: Radiation Oncology

## 2011-10-13 ENCOUNTER — Ambulatory Visit
Admission: RE | Admit: 2011-10-13 | Discharge: 2011-10-13 | Disposition: A | Discharge status: Home / Self Care | Payer: BC Managed Care – PPO | Source: Ambulatory Visit | Attending: Radiation Oncology | Admitting: Radiation Oncology

## 2011-10-13 ENCOUNTER — Encounter: Payer: Self-pay | Admitting: Radiation Oncology

## 2011-10-13 DIAGNOSIS — C50919 Malignant neoplasm of unspecified site of unspecified female breast: Secondary | ICD-10-CM

## 2011-10-13 NOTE — Progress Notes (Signed)
Simulation note:  The patient underwent virtual simulation before her right breast boost. She was setup to a 3 field technique. 3 separate MLC's were constructed to conform the field. An isodose plan was requested. I am prescribing 1000 cGy in 5 sessions utilizing 18 MV photons LAO and 6 MV photons RAO and RPO. I chose the 98% isodose curve to cover the planning target volume.

## 2011-10-13 NOTE — Progress Notes (Signed)
[facility] Weekly Treatment Note    Name: Grace Carroll Date: 10/13/2011 MRN: [id number] DOB: 02-Dec-1950  Status:outpatient    Current dose: 4500cGy  Current fraction:25  Planned dose:6040cGy  Planned fraction:33   ALLERGIES: Codeine    NARRATIVE: Grace Carroll was seen today for weekly treatment management. The chart was checked and port films images were reviewed. The patient developed developed a fever last Thursday and Friday and saw her primary care physician who gave her Rocephin IM. She was then given doxycycline by mouth, but this caused nausea and this was not continued. She has not yet contacted her primary care physician. As expected, she does report right breast discomfort and has been taking Aleve. She is also using radiplex gel  PHYSICAL EXAMINATION: weight is 159 lb 11.2 oz (72.439 kg). Her oral temperature is 97.4 F (36.3 C). Her blood pressure is 115/72 and her pulse is 83. Her respiration is 18.         there is confluent erythema along the right breast, particularly along the anterior axilla. There is patchy dry desquamation but no moist desquamation.    ASSESSMENT: Patient tolerating treatments well with the expected degree of radiation dermatitis.   PLAN: Continue treatment as planned. Again, I will recheck her skin this Wednesday.

## 2011-10-13 NOTE — Progress Notes (Signed)
TEMP LAST Thursday 102.5, SAW PCP.  HAE GAVE HER INJECTION OF ROCEPHIN 1 GM AND STARTED HER ON DOXYCYCLINE BUT SHE WAS UNABLE TO TAKE, MADE HER VOMIT.  DID CBC , CHEST XRAY, EVERYTHING LOOKED FINE PER DR. Ortencia Kick HOFFMAN IN [facility].  VS TODAY......Grace KitchenTEMP 97.4...Grace KitchenRESP 18.Grace KitchenMarland KitchenMarland KitchenP 83......BP.......115/72

## 2011-10-14 ENCOUNTER — Ambulatory Visit
Admission: RE | Admit: 2011-10-14 | Discharge: 2011-10-14 | Disposition: A | Discharge status: Home / Self Care | Payer: BC Managed Care – PPO | Source: Ambulatory Visit | Attending: Radiation Oncology | Admitting: Radiation Oncology

## 2011-10-15 ENCOUNTER — Ambulatory Visit: Payer: BC Managed Care – PPO | Admitting: Radiation Oncology

## 2011-10-15 ENCOUNTER — Ambulatory Visit
Admission: RE | Admit: 2011-10-15 | Discharge: 2011-10-15 | Disposition: A | Discharge status: Home / Self Care | Payer: BC Managed Care – PPO | Source: Ambulatory Visit | Attending: Radiation Oncology | Admitting: Radiation Oncology

## 2011-10-15 ENCOUNTER — Encounter: Payer: Self-pay | Admitting: Radiation Oncology

## 2011-10-15 NOTE — Progress Notes (Signed)
Weekly treatment management note:  The patient is without new complaints today. She is currently at 4860 cGy in 27 sessions of a target dose of 6040 cGy in 33 sessions. She still having discomfort along the axillary tail.  Chart and port films are reviewed.  Physical examination: There is marked erythema of the skin along the right breast with impending moist desquamation along the axillary tail.  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  Plan: Continue with radiation therapy as planned. She is to apply triple antibiotic ointment should she develop a moist desquamation.

## 2011-10-16 ENCOUNTER — Ambulatory Visit
Admission: RE | Admit: 2011-10-16 | Discharge: 2011-10-16 | Disposition: A | Discharge status: Home / Self Care | Payer: BC Managed Care – PPO | Source: Ambulatory Visit | Attending: Radiation Oncology | Admitting: Radiation Oncology

## 2011-10-17 ENCOUNTER — Encounter: Payer: Self-pay | Admitting: Radiation Oncology

## 2011-10-17 ENCOUNTER — Ambulatory Visit
Admission: RE | Admit: 2011-10-17 | Discharge: 2011-10-17 | Disposition: A | Discharge status: Home / Self Care | Payer: BC Managed Care – PPO | Source: Ambulatory Visit | Attending: Radiation Oncology | Admitting: Radiation Oncology

## 2011-10-17 NOTE — Progress Notes (Signed)
[facility] Radiation Oncology Simulation Verification Note   Name: ALAWNA GRAYBEAL MRN: [id number]  Date: 10/17/2011  DOB: 1951-02-12  Status:outpatient    DIAGNOSIS: There were no encounter diagnoses. No matching staging information was found for the patient.  POSITION: Patient is positioned supine and  Isocenter was reviewed.Marland KitchenMLCs were reviewed. Treatment was approved.  NARRATIVE:Set up is excellent and matched planning.

## 2011-10-18 ENCOUNTER — Ambulatory Visit
Admission: RE | Admit: 2011-10-18 | Discharge: 2011-10-18 | Disposition: A | Discharge status: Home / Self Care | Payer: BC Managed Care – PPO | Source: Ambulatory Visit | Attending: Radiation Oncology | Admitting: Radiation Oncology

## 2011-10-20 ENCOUNTER — Ambulatory Visit
Admission: RE | Admit: 2011-10-20 | Discharge: 2011-10-20 | Disposition: A | Discharge status: Home / Self Care | Payer: BC Managed Care – PPO | Source: Ambulatory Visit | Attending: Radiation Oncology | Admitting: Radiation Oncology

## 2011-10-20 VITALS — Wt 159.8 lb

## 2011-10-20 DIAGNOSIS — C50919 Malignant neoplasm of unspecified site of unspecified female breast: Secondary | ICD-10-CM

## 2011-10-20 NOTE — Progress Notes (Signed)
[facility] Weekly Treatment Note    Name: Grace Carroll Date: 10/20/2011 MRN: [id number] DOB: [birth year]  Status:outpatient    Current dose: 5640cGy  Current fraction:31  Planned dose:6040cGy  Planned fraction:33   ALLERGIES: Codeine    NARRATIVE: Ericka Sharlene Motts was seen today for weekly treatment management. The chart was checked and port films images were reviewed. The patient is without complaints today except for the development of "blisters" along her right posterior axilla. She is using Radioplex gel.  PHYSICAL EXAMINATION: weight is 159 lb 12.8 oz (72.485 kg).      there is moderate to severe erythema along the right breast with 3 small vesicles along the right posterior shoulder.    ASSESSMENT: Patient tolerating treatments well. However, she has an impending moist desquamation along the right posterior shoulder where she has 3 small vesicles.   PLAN: Continue treatment as planned. She is to start antibiotic ointment along her right posterior shoulder at she will finish her radiation therapy this Wednesday and then see me back for a one-month followup visit.

## 2011-10-20 NOTE — Progress Notes (Signed)
SKIN BRIGHT RED WITH 3 BLISTERS ON BACK OF SHOULDER

## 2011-10-21 ENCOUNTER — Ambulatory Visit: Payer: BC Managed Care – PPO

## 2011-10-22 ENCOUNTER — Ambulatory Visit
Admission: RE | Admit: 2011-10-22 | Discharge: 2011-10-22 | Disposition: A | Discharge status: Home / Self Care | Payer: BC Managed Care – PPO | Source: Ambulatory Visit | Attending: Radiation Oncology | Admitting: Radiation Oncology

## 2011-10-24 ENCOUNTER — Encounter: Payer: Self-pay | Admitting: Radiation Oncology

## 2011-10-24 ENCOUNTER — Ambulatory Visit: Payer: BC Managed Care – PPO

## 2011-10-24 NOTE — Progress Notes (Signed)
[facility]  Name: Grace Carroll MRN: [id number]  Date: 10/24/2011  DOB: 1951/02/03  Status:outpatient    CC: [name], MD, Avel Peace, MD, Drue Second, MD  REFERRING PHYSICIAN:  [name], MD     DIAGNOSIS: There were no encounter diagnoses. No matching staging information was found for the patient.   INDICATION FOR TREATMENT: Curative   TREATMENT DATES: 09/09/2011 through 10/22/2011  SITE/DOSE: Right breast 5040 cGy 28 sessions right breast boost 1000 cGy 5 sessions   BEAMS/ENERGY: 6 MV photons tangential fields to the right breast. Next 6 MV and 18 MV photons 3 field technique right breast boost.   NARRATIVE: The patient tolerated treatment well although she did have a focal area of impending moist desquamation adjacent to the axilla by completion of therapy. She used Radioplex gel during her course of therapy.   PLAN: Routine followup in one month. Patient instructed to call if questions or worsening complaints in interim.

## 2011-11-03 ENCOUNTER — Encounter (INDEPENDENT_AMBULATORY_CARE_PROVIDER_SITE_OTHER): Payer: Self-pay | Admitting: General Surgery

## 2011-11-03 ENCOUNTER — Ambulatory Visit (INDEPENDENT_AMBULATORY_CARE_PROVIDER_SITE_OTHER): Payer: BC Managed Care – PPO | Admitting: General Surgery

## 2011-11-03 VITALS — BP 140/80 | HR 92 | Temp 97.9°F | Resp 12 | Ht 68.0 in | Wt 157.2 lb

## 2011-11-03 DIAGNOSIS — C50919 Malignant neoplasm of unspecified site of unspecified female breast: Secondary | ICD-10-CM

## 2011-11-03 NOTE — Progress Notes (Signed)
Operation:  Right partial mastectomy, right axillary sentinel lymph node biopsy. Reexcision right partial mastectomy site, Port-A-Cath insertion.  Date:  May 07, 2011. Second operation June 09, 2011.  Pathology: T2 N1a  HPI:  She is here for follow up of her breast cancer.  She is finished with radiation therapy and chemotherapy. She denies any masses in either breast. She denies any adenopathy. She is interested in having the Port-A-Cath removed. She's going to see Dr. Welton Flakes tomorrow.   Physical Exam: Right breast-upper outer scar is present; no palpable dominant masses.. Right chest wall Port-A-Cath is present.    Left breast- Lateral scar is noted; no dominant masses present.  Lymph nodes-no axillary, supraclavicular, or cervical adenopathy. Assessment:  T2 N1 right breast cancer- no clinical evidence of recurrence. Also has a history of left breast cancer and there is no clinical evidence of recurrence on that side either.  Plan:  Return visit in 3-4 months.  We'll remove her Port-A-Cath  If it is okay with Dr. Welton Flakes.  The procedure and risks (including but not limited to bleeding, infection, wound healing problems) were discussed with her.

## 2011-11-03 NOTE — Patient Instructions (Signed)
Call us to schedule your Portacath removal.

## 2011-11-04 ENCOUNTER — Telehealth: Payer: Self-pay | Admitting: *Deleted

## 2011-11-04 ENCOUNTER — Telehealth: Payer: Self-pay | Admitting: Radiation Oncology

## 2011-11-04 ENCOUNTER — Telehealth (INDEPENDENT_AMBULATORY_CARE_PROVIDER_SITE_OTHER): Payer: Self-pay | Admitting: General Surgery

## 2011-11-04 ENCOUNTER — Other Ambulatory Visit: Payer: Self-pay | Admitting: Oncology

## 2011-11-04 ENCOUNTER — Encounter: Payer: Self-pay | Admitting: Oncology

## 2011-11-04 ENCOUNTER — Other Ambulatory Visit (HOSPITAL_BASED_OUTPATIENT_CLINIC_OR_DEPARTMENT_OTHER): Payer: BC Managed Care – PPO | Admitting: Lab

## 2011-11-04 ENCOUNTER — Ambulatory Visit (HOSPITAL_BASED_OUTPATIENT_CLINIC_OR_DEPARTMENT_OTHER): Payer: BC Managed Care – PPO | Admitting: Oncology

## 2011-11-04 DIAGNOSIS — C50419 Malignant neoplasm of upper-outer quadrant of unspecified female breast: Secondary | ICD-10-CM

## 2011-11-04 DIAGNOSIS — R5383 Other fatigue: Secondary | ICD-10-CM

## 2011-11-04 DIAGNOSIS — C50919 Malignant neoplasm of unspecified site of unspecified female breast: Secondary | ICD-10-CM

## 2011-11-04 DIAGNOSIS — Z31438 Encounter for other genetic testing of female for procreative management: Secondary | ICD-10-CM

## 2011-11-04 DIAGNOSIS — R5381 Other malaise: Secondary | ICD-10-CM

## 2011-11-04 DIAGNOSIS — Z170 Estrogen receptor positive status [ER+]: Secondary | ICD-10-CM

## 2011-11-04 HISTORY — DX: Other fatigue: R53.83

## 2011-11-04 LAB — CBC WITH DIFFERENTIAL/PLATELET
Basophils Absolute: 0 10*3/uL (ref 0.0–0.1)
Eosinophils Absolute: 0.1 10*3/uL (ref 0.0–0.5)
HGB: 13.6 g/dL (ref 11.6–15.9)
LYMPH%: 10.9 % — ABNORMAL LOW (ref 14.0–49.7)
MCV: 88.2 fL (ref 79.5–101.0)
MONO%: 6.7 % (ref 0.0–14.0)
NEUT#: 4 10*3/uL (ref 1.5–6.5)
Platelets: 185 10*3/uL (ref 145–400)

## 2011-11-04 LAB — COMPREHENSIVE METABOLIC PANEL
ALT: 39 U/L — ABNORMAL HIGH (ref 0–35)
AST: 36 U/L (ref 0–37)
Albumin: 4.4 g/dL (ref 3.5–5.2)
CO2: 26 mEq/L (ref 19–32)
Calcium: 9.2 mg/dL (ref 8.4–10.5)
Chloride: 106 mEq/L (ref 96–112)
Creatinine, Ser: 0.84 mg/dL (ref 0.50–1.10)
Potassium: 4.4 mEq/L (ref 3.5–5.3)
Total Protein: 6.4 g/dL (ref 6.0–8.3)

## 2011-11-04 MED ORDER — ANASTROZOLE 1 MG PO TABS: 1.0000 mg | ORAL_TABLET | Freq: Every day | ORAL | Status: DC

## 2011-11-04 NOTE — Progress Notes (Signed)
OFFICE PROGRESS NOTE  Dr. Avel Peace  Dr. 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  PRIOR THERAPY:  #1 patient has a history of left breast cancer for which she received lumpectomy with sentinel node biopsy followed by chemotherapy and radiation.  #2 she was then diagnosed with right breast cancer in May 2012. 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 She underwent a lumpectomy with sentinel node biopsy on 05/08/2011 one sentinel node was positive for metastatic disease. The tumor was estrogen receptor positive progesterone receptor negative HER-2/neu negative.  #3 she underwent adjuvant chemotherapy consisting of Taxotere and Cytoxan administered from 06/26/2011 to 08/28/2011 for a total of 4 cycles.  #4 patient completed radiation therapy to the right breast on 10/22/2011.  #5 patient will now begin adjuvant antiestrogen therapy consisting of Arimidex 1 mg daily. We did order a bone density scan today. She is also recommended to start vitamin D  #6 survivorship issues have been discussed with the patient  CURRENT THERAPY: Patient to begin Arimidex 1 mg daily.  INTERVAL HISTORY: Grace Carroll 60 y.o. female returns for followup visit. She completed radiation therapy on 10/22/2011. Overall she did quite well with it without any problems. She denies any fevers chills night sweats headaches chest pains palpitations she has no nausea or vomiting. She is currently denying any myalgias or arthralgias. She does have some fatigue. Remainder of the 10 point review of systems is negative.  MEDICAL HISTORY: Past Medical History  Diagnosis Date  . Ulcer of the stomach and intestine     per h&p Dr Dayton Scrape 04/02/11  . Cancer right breast  . Breast cancer     l breast  lumpectomy- xrt 1992  . Fatigue 11/04/2011    ALLERGIES:  is allergic to codeine and zofran.  MEDICATIONS:  Current Outpatient Prescriptions  Medication Sig Dispense Refill  . ranitidine (ZANTAC) 300 MG tablet Take 300 mg by mouth as needed.        Marland Kitchen anastrozole (ARIMIDEX) 1 MG tablet Take 1 tablet (1 mg total) by mouth daily.  30 tablet  12    SURGICAL HISTORY:  Past Surgical History  Procedure Date  . Appendectomy   . Tubal ligation   . Breast lumpectomy      left and right; right slnbx  . Carpal tunnel release     bilateral  . Shoulder arthroscopy     bone spurs     REVIEW OF SYSTEMS:  Pertinent items are noted in HPI.   PHYSICAL EXAMINATION: General appearance: alert, cooperative and appears stated age Head: Normocephalic, without obvious abnormality, atraumatic Neck: no adenopathy, no carotid bruit, no JVD, supple, symmetrical, trachea midline and thyroid not enlarged, symmetric, no tenderness/mass/nodules Lymph nodes: Cervical, supraclavicular, and axillary nodes normal. Resp: clear to auscultation bilaterally and normal percussion bilaterally Back: symmetric, no curvature. ROM normal. No CVA tenderness. Cardio: regular rate and rhythm, S1, S2 normal, no murmur, click, rub or gallop and normal apical impulse GI: soft, non-tender; bowel sounds normal; no masses,  no organomegaly Extremities: extremities normal, atraumatic, no cyanosis or edema Neurologic: Alert and oriented X 3, normal strength and tone. Normal symmetric reflexes. Normal coordination and gait Bilateral breast examinations are performed. She is noted to have bilateral surgical scars. Right breast reveals some erythema due to  her radiation therapy recently completed. He is no evidence of any masses nipple discharge or other skin changes. Left breast also reveals well-healed surgical scar no masses or nipple discharge. ECOG PERFORMANCE STATUS: 1 - Symptomatic but completely ambulatory  Blood pressure 139/73,  pulse 90, temperature 98.7 F (37.1 C), temperature source Oral, height 5\' 8"  (1.727 m), weight 156 lb 11.2 oz (71.079 kg).  LABORATORY DATA: Lab Results  Component Value Date   WBC 5.0 11/04/2011   HGB 13.6 11/04/2011   HCT 39.8 11/04/2011   MCV 88.2 11/04/2011   PLT 185 11/04/2011      Chemistry      Component Value Date/Time   NA 137 09/04/2011 0908   K 4.1 09/04/2011 0908   CL 101 09/04/2011 0908   CO2 24 09/04/2011 0908   BUN 15 09/04/2011 0908   CREATININE 0.79 09/04/2011 0908      Component Value Date/Time   CALCIUM 8.7 09/04/2011 0908   ALKPHOS 92 08/28/2011 0910   ALKPHOS 92 08/28/2011 0910   AST 14 08/28/2011 0910   AST 14 08/28/2011 0910   ALT 12 08/28/2011 0910   ALT 12 08/28/2011 0910   BILITOT 0.5 08/28/2011 0910   BILITOT 0.5 08/28/2011 0910       RADIOGRAPHIC STUDIES:  No results found.  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 Patient's tumor was ER positive PR negative HER-2/neu negative. She is now status post adjuvant chemotherapy consisting of 4 cycles of Taxotere and Cytoxan administered from 06/26/2011 to 08/28/2011 for a total of 4 cycles. She has also completed radiation therapy to the right breast on 10/22/2011.  #2 patient also has a history of left breast cancer previously.  #3 patient had genetic testing performed and she was negative for the BRCA1 and 2 gene mutation.   PLAN: Plan  #1 patient will now begin adjuvant antiestrogen therapy consisting of Arimidex 1 mg daily. Risks and benefits of this treatment have been discussed with her and the literature has been given to her in the patient instruction form. The prescription was e prescribed to her pharmacy.  #2 patient will have a bone density scan we also discussed use of vitamin D.  #3 patient and I discussed survivorship and tips were given for a healthier  survivorship to her today.  #4 patient will be seen back in 3 months time for followup of her she knows to call me sooner if she starts having problems with the Arimidex.  #5 patient will have Port-A-Cath removed and I have made a referral to Dr. Abbey Chatters.   All questions were answered. The patient knows to call the clinic with any problems, questions or concerns. We can certainly see the patient much sooner if necessary.  I spent 25 minutes counseling the patient face to face. The total time spent in the appointment was 30 minutes.    Drue Second, MD Medical/Oncology [facility] [phone] (beeper) [phone] (Office)  11/04/2011, 9:29 AM

## 2011-11-04 NOTE — Patient Instructions (Addendum)
Anastrozole tablets What is this medicine? ANASTROZOLE (an AS troe zole) is used to treat breast cancer in women who have gone through menopause. Some types of breast cancer depend on estrogen to grow, and this medicine can stop tumor growth by blocking estrogen production. This medicine may be used for other purposes; ask your health care provider or pharmacist if you have questions. What should I tell my health care provider before I take this medicine? They need to know if you have any of these conditions: -liver disease -an unusual or allergic reaction to anastrozole, other medicines, foods, dyes, or preservatives -pregnant or trying to get pregnant -breast-feeding How should I use this medicine? Take this medicine by mouth with a glass of water. Follow the directions on the prescription label. You can take this medicine with or without food. Take your doses at regular intervals. Do not take your medicine more often than directed. Do not stop taking except on the advice of your doctor or health care professional. Talk to your pediatrician regarding the use of this medicine in children. Special care may be needed. Overdosage: If you think you have taken too much of this medicine contact a poison control center or emergency room at once. NOTE: This medicine is only for you. Do not share this medicine with others. What if I miss a dose? If you miss a dose, take it as soon as you can. If it is almost time for your next dose, take only that dose. Do not take double or extra doses. What may interact with this medicine? Do not take this medicine with any of the following medications: -female hormones, like estrogens or progestins and birth control pills This medicine may also interact with the following medications: -tamoxifen This list may not describe all possible interactions. Give your health care provider a list of all the medicines, herbs, non-prescription drugs, or dietary supplements you  use. Also tell them if you smoke, drink alcohol, or use illegal drugs. Some items may interact with your medicine. What should I watch for while using this medicine? Visit your doctor or health care professional for regular checks on your progress. Let your doctor or health care professional know about any unusual vaginal bleeding. Do not treat yourself for diarrhea, nausea, vomiting or other side effects. Ask your doctor or health care professional for advice. What side effects may I notice from receiving this medicine? Side effects that you should report to your doctor or health care professional as soon as possible: -allergic reactions like skin rash, itching or hives, swelling of the face, lips, or tongue -any new or unusual symptoms -breathing problems -chest pain -leg pain or swelling -vomiting Side effects that usually do not require medical attention (report to your doctor or health care professional if they continue or are bothersome): -back or bone pain -cough, or throat infection -diarrhea or constipation -dizziness -headache -hot flashes -loss of appetite -nausea -sweating -weakness and tiredness -weight gain This list may not describe all possible side effects. Call your doctor for medical advice about side effects. You may report side effects to FDA at 1-800-FDA-1088. Where should I keep my medicine? Keep out of the reach of children. Store at room temperature between 20 and 25 degrees C (68 and 77 degrees F). Throw away any unused medicine after the expiration date. NOTE: This sheet is a summary. It may not cover all possible information. If you have questions about this medicine, talk to your doctor, pharmacist, or health care provider.    2012, Elsevier/Gold Standard. (01/28/2008 4:31:52 PM)  Ergocalciferol, Vitamin D2 tablets or capsules What is this medicine? ERGOCALCIFEROL (er goe kal SIF e role) is a man made form of vitamin D. It helps your body keep the right  amount of calcium and phosphorus for healthy bones and teeth. This medicine may be used for other purposes; ask your health care provider or pharmacist if you have questions. What should I tell my health care provider before I take this medicine? They need to know if you have any of the following conditions: -kidney disease -liver disease -other chronic disease -parathyroid disease -stomach disease -an unusual or allergic reaction to vitamin D, other medicines, foods, dyes, or preservatives -pregnant or trying to get pregnant -breast-feeding How should I use this medicine? Take this medicine by mouth with a glass of water. Follow the directions on the prescription label. Take your medicine at regular intervals. Do not take your medicine more often than directed. Talk to your pediatrician regarding the use of this medicine in children. While this drug may be prescribed for children for selected conditions, precautions do apply. Overdosage: If you think you have taken too much of this medicine contact a poison control center or emergency room at once. NOTE: This medicine is only for you. Do not share this medicine with others. What if I miss a dose? If you miss a dose, take it as soon as you can. If it is almost time for your next dose, take only that dose. Do not take double or extra doses. What may interact with this medicine? Do not take this medicine with any of the following medications: -vitamin D This medicine may also interact with the following medications: -digoxin -diuretics -medicines for cholesterol like colestipol or cholestyramine -medicines to treat seizures or nerve pain -mineral oil -orlistat -some over-the-counter supplements This list may not describe all possible interactions. Give your health care provider a list of all the medicines, herbs, non-prescription drugs, or dietary supplements you use. Also tell them if you smoke, drink alcohol, or use illegal drugs. Some  items may interact with your medicine. What should I watch for while using this medicine? Visit your doctor or health care professional for regular checks on your progress. Do not take any non-prescription medicines that have vitamin D, phosphorus, magnesium, or calcium including antacids while taking this medicine, unless your doctor or health care professional says you can. The extra supplements can cause side effects. What side effects may I notice from receiving this medicine? Side effects that you should report to your doctor or health care professional as soon as possible: -allergic reactions like skin rash, itching or hives, swelling of the face, lips, or tongue -bone pain -increased thirst -increased urination (especially at night) -irregular heartbeat, high blood pressure -seizures -unexpected weight loss -unusually weak or tired Side effects that usually do not require medical attention (report to your doctor or health care professional if they continue or are bothersome): -constipation -dry mouth -headache -loss of appetite -metallic taste -stomach upset This list may not describe all possible side effects. Call your doctor for medical advice about side effects. You may report side effects to FDA at 1-800-FDA-1088. Where should I keep my medicine? Keep out of the reach of children. Store at room temperature between 15 and 30 degrees C (59 and 86 degrees F). Protect from light. Keep container tightly closed. Throw away any unused medicine after the expiration date. NOTE: This sheet is a summary. It may not cover all  possible information. If you have questions about this medicine, talk to your doctor, pharmacist, or health care provider.  2012, Elsevier/Gold Standard. (03/20/2008 5:48:44 PM)   Breast Cancer Survivor Follow-Up Breast cancer begins when cells in the breast divide too rapidly. The extra cells form a lump (tumor). When the cancer is treated, the goal is to get rid  of all cancer cells. However, sometimes a few cells survive. These cancer cells can then grow. They become recurrent cancer. This means the cancer comes back after treatment.  Most cases of recurrent breast cancer develop 3 to 5 years after treatment. However, sometimes it comes back just a few months after treatment. Other times, it does not come back until years later. If the cancer comes back in the same area as the first breast cancer, it is called a local recurrence. If the cancer comes back somewhere else in the body, it is called regional recurrence if the site is fairly near the breast or distant recurrence if it is far from the breast. Your caregiver may also use the term metastasize to indicate a cancer that has gone to another part of your body. Treatment is still possible after either kind of recurrence. The cancer can still be controlled.  CAUSES OF RECURRENT CANCER No one knows exactly why breast cancer starts in the first place. Why the cancer comes back after treatment is also not clear. It is known that certain conditions, called risk factors, can make this more likely. They include:  Developing breast cancer for the first time before age 37.   Having breast cancer that involves the lymph nodes. These are small, round pieces of tissue found all over the body. Their job is to help fight infections.   Having a large tumor. Cancer is more apt to come back if the first tumor was bigger than 2 inches (5 cm).   Having certain types of breast cancer, such as:   Inflammatory breast cancer. This rare type grows rapidly and causes the breast to become red and swollen.   A high-grade tumor. The grade of a tumor indicates how fast it will grow and spread. High-grade tumors grow more quickly than other types.   HER2 cancer. This refers to the tumor's genetic makeup. Tumors that have this type of gene are more likely to come back after treatment.   Having close tumor margins. This refers to the  space between the tumor and normal, noncancerous cells. If the space is small, the tumor has a greater chance of coming back.   Having treatment involving a surgery to remove the tumor but not the entire breast (lumpectomy) and no radiation therapy.  CARE AFTER BREAST CANCER Home Monitoring Women who have had breast cancer should continue to examine their breasts every month. The goal is to catch the cancer quickly if it comes back. Many women find it helpful to do so on the same day each month and to mark the calendar as a reminder. Let your caregiver know immediately if you have any signs of recurrent breast cancer. Symptoms will vary, depending on where the cancer recurs. The original type of treatment can also make a difference. Symptoms of local recurrence after a lumpectomy or a recurrence in the opposite breast may include:  A new lump or thickening in the breast.   A change in the way the skin looks on the breast (such as a rash, dimpling, or wrinkling).   Redness or swelling of the breast.   Changes  in the nipple (such as being red, puckered, swollen, or leaking fluid).  Symptoms of a recurrence after a breast removal surgery (mastectomy) may include:  A lump or thickening under the skin.   A thickening around the mastectomy scar.  Symptoms of regional recurrence in the lymph nodes near the breast may include:  A lump under the arm or above the collarbone.   Swelling of the arm.   Pain in the arm, shoulder, or chest.   Numbness in the hand or arm.  Symptoms of distant recurrence may include:  A cough that does not go away.   Trouble breathing or shortness of breath.   Pain in the bones or the chest. This is pain that lasts or does not respond to rest and medicine.   Headaches.   Sudden vision problems.   Dizziness.   Nausea or vomiting.   Losing weight without trying to.   Persistent abdominal pain.   Changes in bowel movements or blood in the stool.    Yellowing of the skin or eyes (jaundice).   Blood in the urine or bloody vaginal discharge.  Clinical Monitoring  It is helpful to keep a schedule of appointments for needed tests and exams. This includes physical exams, breast exams, exams of the lymph nodes, and general exams.   For the first 3 years after being treated for breast cancer, see your caregiver every 3 to 6 months.   For years 4 and 5 after breast cancer, see your caregiver every 6 to 12 months.   After 5 years, see your caregiver at least once a year.   Regular breast X-rays (mammograms) should continue even if you had a mastectomy.   A mammogram should be done 1 year after the mammogram that first detected breast cancer.   A mammogram should be done every 6 to 12 months after that. Follow your caregiver's advice.   A pelvic exam done by your caregiver checks whether female organs are the normal size and shape. The exam is usually done every year. Ask your caregiver if that schedule is right for you.   Women taking tamoxifen should report any vaginal bleeding immediately to their caregiver. Tamoxifen is often given to women with a certain type of breast cancer. It has been shown to help prevent recurrence.   You will need to decide who your primary caregiver will be.   Most people continue to see their cancer specialist (oncologist) every 3 to 6 months for the first year after cancer treatment.   At some point, you may want to go back to seeing your family caregiver. You would no longer see your oncologist for regular checkups. Many women do this about 1 year after their first diagnosis of breast cancer.   You will still need to be seen every so often by your oncologist. Ask how often that should be. Coordinate this with your family or primary caregiver.   Think about having genetic counseling. This would provide information on traits that can be passed or inherited from one generation to the next. In some cases,  breast cancer runs in families. Tell your caregiver if you:   Are of Ashkenazi Jewish heritage.   Have any family member who has had ovarian cancer.   Have a mother, sister, or daughter who had breast cancer before age 84.   Have 2 or more close relatives who have had breast cancer. This means a mother, sister, daughter, aunt, or grandmother.   Had breast cancer  in both breasts.   Have a female relative who has had breast cancer.   Some tests are not recommended for routine screening. Someone recovering from breast cancer does not need to have these tests if there are no problems. The tests have risks, such as radiation exposure, and can be costly. The risks of these tests are thought to be greater than the benefits:   Blood tests.   Chest X-rays.   Bone scans.   Liver ultrasound.   Computed tomography (CT scan).   Positron emission tomography (PET scan).   Magnetic resonance imaging (MRI scan).  DIAGNOSIS OF RECURRENT CANCER Recurrent breast cancer may be suspected for various reasons. A mammogram may not look normal. You might feel a lump or have other symptoms. Your caregiver may find something unusual during an exam. To be sure, your caregiver will probably order some tests. The tests are needed because there are symptoms or hints of a problem. They could include:  Blood tests, including a test to check how well the liver is working. The liver is a common site for a distant cancer recurrence.   Imaging tests that create pictures of the inside of the body. These tests include:   Chest X-rays to show if the cancer has come back in the lungs.   CT scans to create detailed pictures of various areas of the body and help find a distant recurrence.   MRI scans to find anything unusual in the breast, chest, or lymph nodes.   Breast ultrasound tests to examine the breasts.   Bone scans to create a picture of your whole skeleton and find cancer in bony areas.   PET scans to  create an image of the whole body. PET scans can be used together with CT scans to show more detail.   Biopsy. A small sample of tissue is taken and checked under a microscope. If cancer cells are found, they may be tested to see if they contain the HER2 gene or the hormones estrogen and progesterone. This will help your caregiver decide how to treat the recurrent cancer.  TREATMENT  How recurrent breast cancer is treated depends on where the new cancer is found. The type of treatment that was used for the first breast cancer makes a difference, too. A combination of treatments may be used. Options include:  Surgery.   If the cancer comes back in the breast that was not treated before, you may need a lumpectomy or mastectomy.   If the cancer comes back in the breast that was treated before, you may need a mastectomy.   The lymph nodes under the arm may need to be removed.   Radiation therapy.   For a local recurrence, radiation may be used if it was not used during the first treatment.   For a distance recurrence, radiation is sometimes used.   Chemotherapy.   This may be used before surgery to treat recurrent breast cancer.   This may be used to treat recurrent cancer that cannot be treated with surgery.   This may be used to treat a distant recurrence.   Hormone therapy.   Women with the HER2 gene may be given hormone therapy to attack this gene.  Document Released: 07/16/2011 Document Revised: 07/30/2011 Document Reviewed: 07/16/2011 [facility] Patient Information 2012 [place], [place].  Lab Results  Component Value Date   WBC 5.0 11/04/2011   HGB 13.6 11/04/2011   HCT 39.8 11/04/2011   MCV 88.2 11/04/2011   PLT  185 11/04/2011     Chemistry      Component Value Date/Time   NA 137 09/04/2011 0908   K 4.1 09/04/2011 0908   CL 101 09/04/2011 0908   CO2 24 09/04/2011 0908   BUN 15 09/04/2011 0908   CREATININE 0.79 09/04/2011 0908      Component Value Date/Time   CALCIUM 8.7  09/04/2011 0908   ALKPHOS 92 08/28/2011 0910   ALKPHOS 92 08/28/2011 0910   AST 14 08/28/2011 0910   AST 14 08/28/2011 0910   ALT 12 08/28/2011 0910   ALT 12 08/28/2011 0910   BILITOT 0.5 08/28/2011 0910   BILITOT 0.5 08/28/2011 0910

## 2011-11-04 NOTE — Telephone Encounter (Signed)
Patient brought cancer papers to be completed and signed by rad and chemo doctor. I gave Grace Carroll the chemo one to have completed by doctor.  Pt's wants both mailed to her home with bills, once completed.  Dx: 174.9 Female Breast, NOS (excludes Skin of breast T-173.5)  Rad Tx: Extrl Beam [phone])   Attending Rad: Dr. Dayton Scrape   11/10/2011 Rad and Med Onc Cancer papers mailed to pt.

## 2011-11-04 NOTE — Telephone Encounter (Signed)
gave patient appointment for bone density 11-11-2011 at 3:30pm. gave appointment for 01-2012

## 2011-11-06 ENCOUNTER — Other Ambulatory Visit (INDEPENDENT_AMBULATORY_CARE_PROVIDER_SITE_OTHER): Payer: Self-pay | Admitting: General Surgery

## 2011-11-11 ENCOUNTER — Ambulatory Visit
Admission: RE | Admit: 2011-11-11 | Discharge: 2011-11-11 | Disposition: A | Discharge status: Home / Self Care | Payer: BC Managed Care – PPO | Source: Ambulatory Visit | Attending: Oncology | Admitting: Oncology

## 2011-11-11 DIAGNOSIS — C50919 Malignant neoplasm of unspecified site of unspecified female breast: Secondary | ICD-10-CM

## 2011-11-19 ENCOUNTER — Other Ambulatory Visit: Payer: Self-pay | Admitting: Oncology

## 2011-11-19 ENCOUNTER — Telehealth: Payer: Self-pay | Admitting: *Deleted

## 2011-11-19 DIAGNOSIS — C50919 Malignant neoplasm of unspecified site of unspecified female breast: Secondary | ICD-10-CM

## 2011-11-19 MED ORDER — ALENDRONATE SODIUM 70 MG PO TABS: 70.0000 mg | ORAL_TABLET | ORAL | Status: DC

## 2011-11-19 NOTE — Telephone Encounter (Signed)
Message copied by Cooper Render on Wed Nov 19, 2011  3:35 PM ------      Message from: Victorino December      Created: Wed Nov 19, 2011 12:07 AM       Call patient: bone density shows osteoporsis      Start: fosamax 70 mg po q week      Vitamin d3 5000 units daily      Calcium tabs daily

## 2011-11-19 NOTE — Telephone Encounter (Signed)
Pt notified. Pt verbalized she will have PAC removed tomorrow and will not make it to pharmacy until Friday, however she will pick up Fosamax and purchase VIt D3/calcium.

## 2011-11-20 DIAGNOSIS — Z452 Encounter for adjustment and management of vascular access device: Secondary | ICD-10-CM

## 2011-11-20 HISTORY — PX: PORT-A-CATH REMOVAL: SHX5289

## 2011-12-09 ENCOUNTER — Ambulatory Visit
Admission: RE | Admit: 2011-12-09 | Discharge: 2011-12-09 | Disposition: A | Discharge status: Home / Self Care | Payer: BC Managed Care – PPO | Source: Ambulatory Visit | Attending: Radiation Oncology | Admitting: Radiation Oncology

## 2011-12-09 ENCOUNTER — Encounter: Payer: Self-pay | Admitting: Radiation Oncology

## 2011-12-09 VITALS — BP 130/74 | HR 76 | Temp 97.8°F | Resp 18 | Wt 157.6 lb

## 2011-12-09 DIAGNOSIS — C50919 Malignant neoplasm of unspecified site of unspecified female breast: Secondary | ICD-10-CM

## 2011-12-09 NOTE — Progress Notes (Signed)
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 She is without complaints today. She is on anastrozole. She will see Dr. Welton Flakes in March and Dr. Abbey Chatters in April. She ordinarily has her annual mammography in April.  Physical examination: Head and neck examination: Grossly unremarkable. Nodes: Without palpable cervical, supraclavicular, or axillary lymphadenopathy. Chest: Lungs clear. Breasts: There is residual hyperpigmentation of the skin along the right breast with mild thickening of the breast. No dominant masses are appreciated. Left breast without masses or lesions. Abdomen without hepatomegaly. Extremities: Without edema.  Impression: Satisfactory progress.  Plan: She will see Dr. Welton Flakes in March and Dr. Abbey Chatters in April. I suspect that Dr. Welton Flakes or Dr. Abbey Chatters will get her scheduled for bilateral mammography sometime in April. I've not scheduled the patient for a formal followup visit, but I would be more than happy to see her in the future should the need arise.

## 2011-12-09 NOTE — Progress Notes (Signed)
Patient presents to the clinic today unaccompanied for a follow up appointment with Dr. Dayton Scrape. Patient is alert and oriented to person, place, and time. No distress noted. Steady gait noted. Pleasant affect noted. Patient reports her skin looks "good" but, has some "tanning." Encouraged patient to use an over the counter lotion with vitamin e to improve tanning. Patient verbalized understanding. Patient scheduled to follow up with Dr. Welton Flakes in March and Dr. Joannie Springs in February. Patient denies nausea, vomiting, diarrhea, or constipation. Patient reports eating and sleeping without difficulty.

## 2012-01-12 ENCOUNTER — Encounter: Payer: Self-pay | Admitting: *Deleted

## 2012-01-12 DIAGNOSIS — R079 Chest pain, unspecified: Secondary | ICD-10-CM

## 2012-01-12 DIAGNOSIS — C50919 Malignant neoplasm of unspecified site of unspecified female breast: Secondary | ICD-10-CM

## 2012-01-13 ENCOUNTER — Encounter: Payer: Self-pay | Admitting: *Deleted

## 2012-01-13 NOTE — Progress Notes (Signed)
01/12/12 late entry. Pt reports that she "hurts all over, has a rash on Left arm and has had a low grade fever (Sunday)" pt feels that this is due to  Arimidex. Per MD, pt has been notified to stop the Arimidex for 1 week  and is scheduled to see MD on Feb. 19th at 1130 with labs at 1100.

## 2012-01-20 ENCOUNTER — Encounter: Payer: Self-pay | Admitting: Oncology

## 2012-01-20 ENCOUNTER — Other Ambulatory Visit (HOSPITAL_BASED_OUTPATIENT_CLINIC_OR_DEPARTMENT_OTHER): Payer: BC Managed Care – PPO | Admitting: Lab

## 2012-01-20 ENCOUNTER — Ambulatory Visit (HOSPITAL_BASED_OUTPATIENT_CLINIC_OR_DEPARTMENT_OTHER): Payer: BC Managed Care – PPO | Admitting: Oncology

## 2012-01-20 DIAGNOSIS — C50919 Malignant neoplasm of unspecified site of unspecified female breast: Secondary | ICD-10-CM

## 2012-01-20 DIAGNOSIS — R079 Chest pain, unspecified: Secondary | ICD-10-CM

## 2012-01-20 DIAGNOSIS — M810 Age-related osteoporosis without current pathological fracture: Secondary | ICD-10-CM

## 2012-01-20 DIAGNOSIS — Z170 Estrogen receptor positive status [ER+]: Secondary | ICD-10-CM

## 2012-01-20 DIAGNOSIS — Z79811 Long term (current) use of aromatase inhibitors: Secondary | ICD-10-CM

## 2012-01-20 LAB — COMPREHENSIVE METABOLIC PANEL
ALT: 22 U/L (ref 0–35)
Alkaline Phosphatase: 112 U/L (ref 39–117)
CO2: 27 mEq/L (ref 19–32)
Creatinine, Ser: 0.96 mg/dL (ref 0.50–1.10)
Sodium: 136 mEq/L (ref 135–145)
Total Bilirubin: 0.5 mg/dL (ref 0.3–1.2)
Total Protein: 6.3 g/dL (ref 6.0–8.3)

## 2012-01-20 LAB — CBC WITH DIFFERENTIAL/PLATELET
BASO%: 0.6 % (ref 0.0–2.0)
HCT: 37.7 % (ref 34.8–46.6)
LYMPH%: 23.3 % (ref 14.0–49.7)
MCH: 29.3 pg (ref 25.1–34.0)
MCHC: 33.7 g/dL (ref 31.5–36.0)
MCV: 87.1 fL (ref 79.5–101.0)
MONO%: 7.2 % (ref 0.0–14.0)
NEUT%: 66.9 % (ref 38.4–76.8)
Platelets: 266 10*3/uL (ref 145–400)
RBC: 4.33 10*6/uL (ref 3.70–5.45)

## 2012-01-20 NOTE — Patient Instructions (Signed)
1. Go back to the Arimidex 1 mg daily 2. Continue to monitor  Your symptoms of aches and pains. 3. If they recur after restarting the arimidex then call Dr. Welton Flakes  And stop the arimidex.  4. Follow up as you are scheduled in March 2013 5. Suggest increasing your activity and exercise

## 2012-01-20 NOTE — Progress Notes (Signed)
OFFICE PROGRESS NOTE  Dr. Avel Peace  Dr. Criselda Peaches, MD, MD [address] 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  PRIOR THERAPY:  #1 patient has a history of left breast cancer for which she received lumpectomy with sentinel node biopsy followed by chemotherapy and radiation.  #2 she was then diagnosed with right breast cancer in May 2012. 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 She underwent a lumpectomy with sentinel node biopsy on 05/08/2011 one sentinel node was positive for metastatic disease. The tumor was estrogen receptor positive progesterone receptor negative HER-2/neu negative.  #3 she underwent adjuvant chemotherapy consisting of Taxotere and Cytoxan administered from 06/26/2011 to 08/28/2011 for a total of 4 cycles.  #4 patient completed radiation therapy to the right breast on 10/22/2011.  #5 patient will now begin adjuvant antiestrogen therapy consisting of Arimidex 1 mg daily. We did order a bone density scan today. She is also recommended to start vitamin D  #6 survivorship issues have been discussed with the patient  CURRENT THERAPY: Patient to begin Arimidex 1 mg daily since 11/04/11  INTERVAL HISTORY: Grace Carroll 61 y.o. female returns for followup visit. She completed radiation therapy on 10/22/2011. Patient started on arimidex and fosamax in December and tolerated it well. Two weeks ago on a trip to the mountains in the car for 1 hour and when she got out she was aches and pains all over but primarily in the left leg. Called our office with complaint of low grade fever, with aches and pains and chills no cold sinuses or sore throat. Patient was advised to stop the arimidex. The symptoms not  Improved. Patient still has back pain. Rash noted on 01/11/12 on the arm on the  left. No other areas noted. Now resolved. She took one benadryl and the rash went away.  No headaches or fevers, no chills now. Feels much better in comparison to last week. Remainder of the 10 point review of systems is negative.  MEDICAL HISTORY: Past Medical History  Diagnosis Date  . Ulcer of the stomach and intestine     per h&p Dr Dayton Scrape 04/02/11  . Cancer right breast  . Breast cancer     l breast lumpectomy- xrt 1992  . Fatigue 11/04/2011    ALLERGIES:  is allergic to codeine and zofran.  MEDICATIONS:  Current Outpatient Prescriptions  Medication Sig Dispense Refill  . alendronate (FOSAMAX) 70 MG tablet Take 1 tablet (70 mg total) by mouth every 7 (seven) days. Take with a full glass of water on an empty stomach.  12 tablet  6  . calcium carbonate 200 MG capsule Take 250 mg by mouth 2 (two) times daily with a meal.        . cholecalciferol (VITAMIN D) 400 UNITS TABS Take 1,000 Units by mouth.      . ranitidine (ZANTAC) 300 MG tablet Take 300 mg by mouth as needed.        . Vitamin D, Ergocalciferol, (DRISDOL) 50000 UNITS CAPS Take 50,000 Units by mouth.        Marland Kitchen anastrozole (ARIMIDEX) 1 MG tablet Take 1 mg by mouth daily.          SURGICAL HISTORY:  Past Surgical History  Procedure Date  . Appendectomy   . Tubal ligation   . Breast lumpectomy  left and right; right slnbx  . Carpal tunnel release     bilateral  . Shoulder arthroscopy     bone spurs     REVIEW OF SYSTEMS:  Pertinent items are noted in HPI.   PHYSICAL EXAMINATION: General appearance: alert, cooperative and appears stated age Head: Normocephalic, without obvious abnormality, atraumatic Neck: no adenopathy, no carotid bruit, no JVD, supple, symmetrical, trachea midline and thyroid not enlarged, symmetric, no tenderness/mass/nodules Lymph nodes: Cervical, supraclavicular, and axillary nodes normal. Resp: clear to auscultation bilaterally and normal percussion bilaterally Back: symmetric, no curvature.  ROM normal. No CVA tenderness. Cardio: regular rate and rhythm, S1, S2 normal, no murmur, click, rub or gallop and normal apical impulse GI: soft, non-tender; bowel sounds normal; no masses,  no organomegaly Extremities: extremities normal, atraumatic, no cyanosis or edema Neurologic: Alert and oriented X 3, normal strength and tone. Normal symmetric reflexes. Normal coordination and gait Bilateral breast examinations are performed. She is noted to have bilateral surgical scars. Right breast reveals some erythema due to her radiation therapy recently completed. He is no evidence of any masses nipple discharge or other skin changes. Left breast also reveals well-healed surgical scar no masses or nipple discharge. ECOG PERFORMANCE STATUS: 1 - Symptomatic but completely ambulatory  Blood pressure 133/77, pulse 77, temperature 98.8 F (37.1 C), temperature source Oral, height 5\' 8"  (1.727 m), weight 156 lb 4.8 oz (70.897 kg).  LABORATORY DATA: Lab Results  Component Value Date   WBC 4.3 01/20/2012   HGB 12.7 01/20/2012   HCT 37.7 01/20/2012   MCV 87.1 01/20/2012   PLT 266 01/20/2012      Chemistry      Component Value Date/Time   NA 139 11/04/2011 0830   NA 139 11/04/2011 0830   K 4.4 11/04/2011 0830   K 4.4 11/04/2011 0830   CL 106 11/04/2011 0830   CL 106 11/04/2011 0830   CO2 26 11/04/2011 0830   CO2 26 11/04/2011 0830   BUN 16 11/04/2011 0830   BUN 16 11/04/2011 0830   CREATININE 0.84 11/04/2011 0830   CREATININE 0.84 11/04/2011 0830      Component Value Date/Time   CALCIUM 9.2 11/04/2011 0830   CALCIUM 9.2 11/04/2011 0830   ALKPHOS 116 11/04/2011 0830   ALKPHOS 116 11/04/2011 0830   AST 36 11/04/2011 0830   AST 36 11/04/2011 0830   ALT 39* 11/04/2011 0830   ALT 39* 11/04/2011 0830   BILITOT 0.5 11/04/2011 0830   BILITOT 0.5 11/04/2011 0830     ASSESSMENT: 61 year old female with  #1 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 Patient's tumor was ER positive PR negative HER-2/neu negative. She is now status post adjuvant chemotherapy consisting of 4 cycles of Taxotere and Cytoxan administered from 06/26/2011 to 08/28/2011 for a total of 4 cycles. She has also completed radiation therapy to the right breast on 10/22/2011.  #2 patient also has a history of left breast cancer previously.  #3 patient had genetic testing performed and she was negative for the BRCA1 and 2 gene mutation.  #4 Patient on Arimidex since 11/04/11  #5. Complaints of aches and pains likely due to viral infection. We held the arimidex and in spite of it she continues to have fatigue and achiness.  #6 Osteopenia/Osteoporosis  PLAN:   #1 patient on  adjuvant antiestrogen therapy consisting of Arimidex 1 mg daily. But held for the  past 1 week due to possible side effects But no improvement in her symptoms so likely the symptoms not from the arimidex.. Recommend going back on the arimidex. Call me if the complaints begin again then we will have to switch her to a different agent  #2 patient will have a bone density scan we also discussed use of vitamin D. On fosamax q weekly  #3 Continue the fosamax as prescribed   All questions were answered. The patient knows to call the clinic with any problems, questions or concerns. We can certainly see the patient much sooner if necessary.  I spent 25 minutes counseling the patient face to face. The total time spent in the appointment was 30 minutes.    Drue Second, MD Medical/Oncology [facility] [phone] (beeper) [phone] (Office)  01/20/2012, 11:40 AM

## 2012-01-22 ENCOUNTER — Encounter: Payer: Self-pay | Admitting: Oncology

## 2012-01-22 NOTE — Progress Notes (Signed)
Put aflac form on nurse's desk °

## 2012-01-26 ENCOUNTER — Encounter: Payer: Self-pay | Admitting: Oncology

## 2012-01-26 NOTE — Progress Notes (Signed)
Put aflac form in registration desk °

## 2012-01-30 ENCOUNTER — Encounter (INDEPENDENT_AMBULATORY_CARE_PROVIDER_SITE_OTHER): Payer: Self-pay | Admitting: General Surgery

## 2012-02-09 ENCOUNTER — Other Ambulatory Visit: Payer: BC Managed Care – PPO | Admitting: Lab

## 2012-02-09 ENCOUNTER — Ambulatory Visit: Payer: BC Managed Care – PPO | Admitting: Oncology

## 2012-02-23 ENCOUNTER — Encounter: Payer: Self-pay | Admitting: Oncology

## 2012-02-23 ENCOUNTER — Other Ambulatory Visit: Payer: Self-pay | Admitting: Oncology

## 2012-02-23 ENCOUNTER — Telehealth: Payer: Self-pay | Admitting: *Deleted

## 2012-02-23 ENCOUNTER — Other Ambulatory Visit (HOSPITAL_BASED_OUTPATIENT_CLINIC_OR_DEPARTMENT_OTHER): Payer: BC Managed Care – PPO | Admitting: Lab

## 2012-02-23 ENCOUNTER — Ambulatory Visit (HOSPITAL_BASED_OUTPATIENT_CLINIC_OR_DEPARTMENT_OTHER): Payer: BC Managed Care – PPO | Admitting: Oncology

## 2012-02-23 VITALS — BP 127/76 | HR 79 | Temp 98.2°F | Ht 68.0 in | Wt 158.7 lb

## 2012-02-23 DIAGNOSIS — C50919 Malignant neoplasm of unspecified site of unspecified female breast: Secondary | ICD-10-CM

## 2012-02-23 DIAGNOSIS — Z9889 Other specified postprocedural states: Secondary | ICD-10-CM

## 2012-02-23 DIAGNOSIS — Z853 Personal history of malignant neoplasm of breast: Secondary | ICD-10-CM

## 2012-02-23 LAB — CBC WITH DIFFERENTIAL/PLATELET
Eosinophils Absolute: 0.1 10*3/uL (ref 0.0–0.5)
HCT: 41.7 % (ref 34.8–46.6)
LYMPH%: 23.2 % (ref 14.0–49.7)
MCV: 89.1 fL (ref 79.5–101.0)
MONO#: 0.2 10*3/uL (ref 0.1–0.9)
MONO%: 5.1 % (ref 0.0–14.0)
NEUT#: 3.2 10*3/uL (ref 1.5–6.5)
NEUT%: 69.9 % (ref 38.4–76.8)
Platelets: 243 10*3/uL (ref 145–400)
RBC: 4.68 10*6/uL (ref 3.70–5.45)
WBC: 4.5 10*3/uL (ref 3.9–10.3)

## 2012-02-23 NOTE — Patient Instructions (Signed)
1. Continue arimidex daily  2. I will see you back in 3 months (6/21)

## 2012-02-23 NOTE — Progress Notes (Signed)
OFFICE PROGRESS NOTE  Dr. Avel Peace  Dr. Criselda Peaches, MD, MD [address] 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  PRIOR THERAPY:  #1 patient has a history of left breast cancer for which she received lumpectomy with sentinel node biopsy followed by chemotherapy and radiation.  #2 she was then diagnosed with right breast cancer in May 2012. 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 She underwent a lumpectomy with sentinel node biopsy on 05/08/2011 one sentinel node was positive for metastatic disease. The tumor was estrogen receptor positive progesterone receptor negative HER-2/neu negative.  #3 she underwent adjuvant chemotherapy consisting of Taxotere and Cytoxan administered from 06/26/2011 to 08/28/2011 for a total of 4 cycles.  #4 patient completed radiation therapy to the right breast on 10/22/2011.  #5 patient will now begin adjuvant antiestrogen therapy consisting of Arimidex 1 mg daily. We did order a bone density scan today. She is also recommended to start vitamin D  #6 survivorship issues have been discussed with the patient  CURRENT THERAPY: Patient to begin Arimidex 1 mg daily since 11/04/11  INTERVAL HISTORY: Grace Carroll 61 y.o. female returns for followup visit. She completed radiation therapy on 10/22/2011. Patient started on arimidex and fosamax in December and tolerated it well. Two weeks ago on a trip to the mountains in the car for 1 hour and when she got out she was aches and pains all over but primarily in the left leg. Called our office with complaint of low grade fever, with aches and pains and chills no cold sinuses or sore throat. Patient was advised to stop the arimidex. The symptoms not  Improved. Patient still has back pain. Rash noted on 01/11/12 on the arm on the  left. No other areas noted. Now resolved. She took one benadryl and the rash went away.  No headaches or fevers, no chills now. Feels much better in comparison to last week. Remainder of the 10 point review of systems is negative.  MEDICAL HISTORY: Past Medical History  Diagnosis Date  . Ulcer of the stomach and intestine     per h&p Dr [place] Scrape 04/02/11  . Cancer right breast  . Breast cancer     l breast lumpectomy- xrt 1992  . Fatigue 11/04/2011    ALLERGIES:  is allergic to codeine and zofran.  MEDICATIONS:  Current Outpatient Prescriptions  Medication Sig Dispense Refill  . alendronate (FOSAMAX) 70 MG tablet Take 1 tablet (70 mg total) by mouth every 7 (seven) days. Take with a full glass of water on an empty stomach.  12 tablet  6  . anastrozole (ARIMIDEX) 1 MG tablet Take 1 mg by mouth daily. 01/20/12 restarted      . calcium carbonate 200 MG capsule Take 250 mg by mouth 2 (two) times daily with a meal.        . cholecalciferol (VITAMIN D) 400 UNITS TABS Take 4,000 Units by mouth daily.       . ranitidine (ZANTAC) 300 MG tablet Take 300 mg by mouth as needed.          SURGICAL HISTORY:  Past Surgical History  Procedure Date  . Appendectomy   . Tubal ligation   . Breast lumpectomy      left and right; right slnbx  . Carpal tunnel release  bilateral  . Shoulder arthroscopy     bone spurs     REVIEW OF SYSTEMS:  Pertinent items are noted in HPI.   PHYSICAL EXAMINATION: General appearance: alert, cooperative and appears stated age Head: Normocephalic, without obvious abnormality, atraumatic Neck: no adenopathy, no carotid bruit, no JVD, supple, symmetrical, trachea midline and thyroid not enlarged, symmetric, no tenderness/mass/nodules Lymph nodes: Cervical, supraclavicular, and axillary nodes normal. Resp: clear to auscultation bilaterally and normal percussion bilaterally Back: symmetric, no curvature. ROM normal. No CVA tenderness. Cardio: regular rate and rhythm, S1,  S2 normal, no murmur, click, rub or gallop and normal apical impulse GI: soft, non-tender; bowel sounds normal; no masses,  no organomegaly Extremities: extremities normal, atraumatic, no cyanosis or edema Neurologic: Alert and oriented X 3, normal strength and tone. Normal symmetric reflexes. Normal coordination and gait Bilateral breast examinations are performed. She is noted to have bilateral surgical scars. Right breast reveals some erythema due to her radiation therapy recently completed. He is no evidence of any masses nipple discharge or other skin changes. Left breast also reveals well-healed surgical scar no masses or nipple discharge. ECOG PERFORMANCE STATUS: 1 - Symptomatic but completely ambulatory  Blood pressure 127/76, pulse 79, temperature 98.2 F (36.8 C), temperature source Oral, height 5\' 8"  (1.727 m), weight 158 lb 11.2 oz (71.986 kg).  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 Patient's tumor was ER positive PR negative HER-2/neu negative. She is now status post adjuvant chemotherapy consisting of 4 cycles of Taxotere and Cytoxan administered from 06/26/2011 to 08/28/2011 for a total of 4 cycles. She has also completed radiation therapy to the right breast on 10/22/2011.  #2 patient also has a history  of left breast cancer previously.  #3 patient had genetic testing performed and she was negative for the BRCA1 and 2 gene mutation.  #4 Patient on Arimidex since 11/04/11  #5. Complaints of aches and pains likely due to viral infection. We held the arimidex and in spite of it she continues to have fatigue and achiness.  #6 Osteopenia/Osteoporosis  #7. Left Knee stiffness and pain - question whether patient has some arthritis.  PLAN:   #1 patient on  adjuvant antiestrogen therapy consisting of Arimidex 1 mg daily. But held for the past 1 week due to possible side effects But no improvement in her symptoms so likely the symptoms not from the arimidex.. Recommend going back on the arimidex. Call me if the complaints begin again then we will have to switch her to a different agent  #2 patient will have a bone density scan we also discussed use of vitamin D. On fosamax q weekly  #  3 Continue the fosamax as prescribed  #4. If her left knee stiff does not improve we may consider doing x-ray or MRI for further evaluation   All questions were answered. The patient knows to call the clinic with any problems, questions or concerns. We can certainly see the patient much sooner if necessary.  I spent 25 minutes counseling the patient face to face. The total time spent in the appointment was 30 minutes.    Drue Second, MD Medical/Oncology [facility] [phone] (beeper) [phone] (Office)  02/23/2012, 5:13 PM

## 2012-02-23 NOTE — Telephone Encounter (Signed)
gave patient appointment for 05-21-2012 starting at 10:30am printed out calendar and gave to the patient

## 2012-02-24 ENCOUNTER — Encounter: Payer: Self-pay | Admitting: *Deleted

## 2012-02-24 LAB — COMPREHENSIVE METABOLIC PANEL
Alkaline Phosphatase: 106 U/L (ref 39–117)
BUN: 17 mg/dL (ref 6–23)
CO2: 28 mEq/L (ref 19–32)
Creatinine, Ser: 0.83 mg/dL (ref 0.50–1.10)
Glucose, Bld: 96 mg/dL (ref 70–99)
Sodium: 140 mEq/L (ref 135–145)
Total Bilirubin: 0.5 mg/dL (ref 0.3–1.2)

## 2012-02-24 LAB — VITAMIN D 25 HYDROXY (VIT D DEFICIENCY, FRACTURES): Vit D, 25-Hydroxy: 43 ng/mL (ref 30–89)

## 2012-02-24 NOTE — Progress Notes (Unsigned)
Per MD, notified pt to take vitamin D3 5000 units daily. Pt reports that she is "already taking 5000U. Will discuss with MD upon her return

## 2012-02-25 ENCOUNTER — Other Ambulatory Visit (INDEPENDENT_AMBULATORY_CARE_PROVIDER_SITE_OTHER): Payer: Self-pay | Admitting: General Surgery

## 2012-02-25 ENCOUNTER — Ambulatory Visit (INDEPENDENT_AMBULATORY_CARE_PROVIDER_SITE_OTHER): Payer: BC Managed Care – PPO | Admitting: General Surgery

## 2012-02-25 ENCOUNTER — Encounter (INDEPENDENT_AMBULATORY_CARE_PROVIDER_SITE_OTHER): Payer: Self-pay | Admitting: General Surgery

## 2012-02-25 ENCOUNTER — Telehealth: Payer: Self-pay | Admitting: *Deleted

## 2012-02-25 VITALS — BP 131/72 | HR 87 | Temp 98.6°F | Ht 68.0 in | Wt 157.0 lb

## 2012-02-25 DIAGNOSIS — Z853 Personal history of malignant neoplasm of breast: Secondary | ICD-10-CM

## 2012-02-25 DIAGNOSIS — N6310 Unspecified lump in the right breast, unspecified quadrant: Secondary | ICD-10-CM

## 2012-02-25 NOTE — Telephone Encounter (Signed)
Reviewed with MD pt taking Vitamin D3 5000 units daily. Called pt LMOVM -Per MD- pt to continue will recheck labs upon next office visit Requested call back confirming msg received.

## 2012-02-25 NOTE — Patient Instructions (Signed)
We will schedule you to have an [place] as well as your mammogram in April.

## 2012-02-25 NOTE — Progress Notes (Signed)
Operation:  Right partial mastectomy, right axillary sentinel lymph node biopsy. Reexcision right partial mastectomy site, Port-A-Cath insertion.  Date:  May 07, 2011. Second operation June 09, 2011.  Pathology: T2 N1a  HPI:  She is here for follow up of her breast cancer.  She is finished with radiation therapy and chemotherapy.  She has noticed an uncomfortable nodule in the lateral aspect of the right breast.  No adenopathy.  Physical Exam: Right breast-upper outer scar is present; at the 9:00 position, 12 cm from the areola is an area of indentation with a small palpable nodule. Left breast- Lateral scar is noted; no dominant masses present.  Lymph nodes-no axillary, supraclavicular, or cervical adenopathy. Assessment:  T2 N1 right breast cancer-new palpable nodule with skin indentation as above.  Plan: Diagnostic mammogram and [place], scheduled for 03/12/12.  Follow up in one month.  If area persists, plan outpatient excision.

## 2012-03-12 ENCOUNTER — Ambulatory Visit
Admission: RE | Admit: 2012-03-12 | Discharge: 2012-03-12 | Disposition: A | Discharge status: Home / Self Care | Payer: BC Managed Care – PPO | Source: Ambulatory Visit | Attending: General Surgery | Admitting: General Surgery

## 2012-03-12 ENCOUNTER — Ambulatory Visit
Admission: RE | Admit: 2012-03-12 | Discharge: 2012-03-12 | Disposition: A | Discharge status: Home / Self Care | Payer: BC Managed Care – PPO | Source: Ambulatory Visit | Attending: Oncology | Admitting: Oncology

## 2012-03-12 DIAGNOSIS — N6310 Unspecified lump in the right breast, unspecified quadrant: Secondary | ICD-10-CM

## 2012-03-12 DIAGNOSIS — Z853 Personal history of malignant neoplasm of breast: Secondary | ICD-10-CM

## 2012-03-12 DIAGNOSIS — Z9889 Other specified postprocedural states: Secondary | ICD-10-CM

## 2012-03-18 ENCOUNTER — Telehealth: Payer: Self-pay | Admitting: *Deleted

## 2012-03-18 NOTE — Telephone Encounter (Signed)
Message copied by Maxon Kresse, Gerald Leitz on Thu Mar 18, 2012  5:13 PM ------      Message from: Victorino December      Created: Thu Mar 18, 2012  2:18 PM       Call patient: mammogram looks good

## 2012-03-18 NOTE — Telephone Encounter (Signed)
Per MD notified pt mammogram looks good. Pt verbalized understanding.

## 2012-03-26 ENCOUNTER — Encounter (INDEPENDENT_AMBULATORY_CARE_PROVIDER_SITE_OTHER): Payer: Self-pay | Admitting: General Surgery

## 2012-03-26 ENCOUNTER — Ambulatory Visit (INDEPENDENT_AMBULATORY_CARE_PROVIDER_SITE_OTHER): Payer: BC Managed Care – PPO | Admitting: General Surgery

## 2012-03-26 VITALS — BP 126/84 | HR 80 | Temp 98.6°F | Resp 16 | Ht 68.0 in | Wt 154.4 lb

## 2012-03-26 DIAGNOSIS — N63 Unspecified lump in unspecified breast: Secondary | ICD-10-CM

## 2012-03-26 DIAGNOSIS — N6310 Unspecified lump in the right breast, unspecified quadrant: Secondary | ICD-10-CM

## 2012-03-26 NOTE — Progress Notes (Signed)
She's here for followup of the right lateral breast mass. An ultrasound demonstrates residual seroma laterally and also, with a palpable masses, a solid mass present which could be scar tissue or fatty necrosis.  Exam: The right lateral breast mass remains present with mild skin indentation.  Assessment:T2N1 right breast cancer with a nodule in the right lateral breast. I think this is to be removed.  Plan: Removal of right lateral breast mass. We have discussed the procedure, rationale and risks. Risks include but are not limited to bleeding, infection, wound healing problems come anesthesia. She seems to understand and agrees with the plan.

## 2012-03-26 NOTE — Patient Instructions (Signed)
We will schedule your surgery today. 

## 2012-04-05 ENCOUNTER — Telehealth: Payer: Self-pay | Admitting: Medical Oncology

## 2012-04-05 NOTE — Telephone Encounter (Addendum)
Received call from patient stating "I am having pain in my right arm and my primary doctor gave me 2 shots in it but it's still painful.  He did a x-ray and it didn't show anything and said I may need a MRI.  My doctor is out of town and I am coming to see Dr. Welton Flakes soon, so do I need an MRI for her or go see my doctor's nurse practitioner?"  Will review with MD  Patients next scheduled visit with Dr. Welton Flakes: 05/21/2012  Spoke with patient and advised patient per Dr. Welton Flakes, she should see the nurse practitioner for her family doctor. Patient expressed understanding.

## 2012-04-05 NOTE — Telephone Encounter (Signed)
Go see PCP NP

## 2012-04-07 ENCOUNTER — Telehealth (INDEPENDENT_AMBULATORY_CARE_PROVIDER_SITE_OTHER): Payer: Self-pay | Admitting: General Surgery

## 2012-04-16 IMAGING — MG MM BREAST NEEDLE LOCALIZATION*R*
5 series · 5 of 5 positions shown · non-contrast
Comparison: none

CLINICAL DATA: 11 biopsy right breast for lumpectomy

[R LM (1 of 3)]
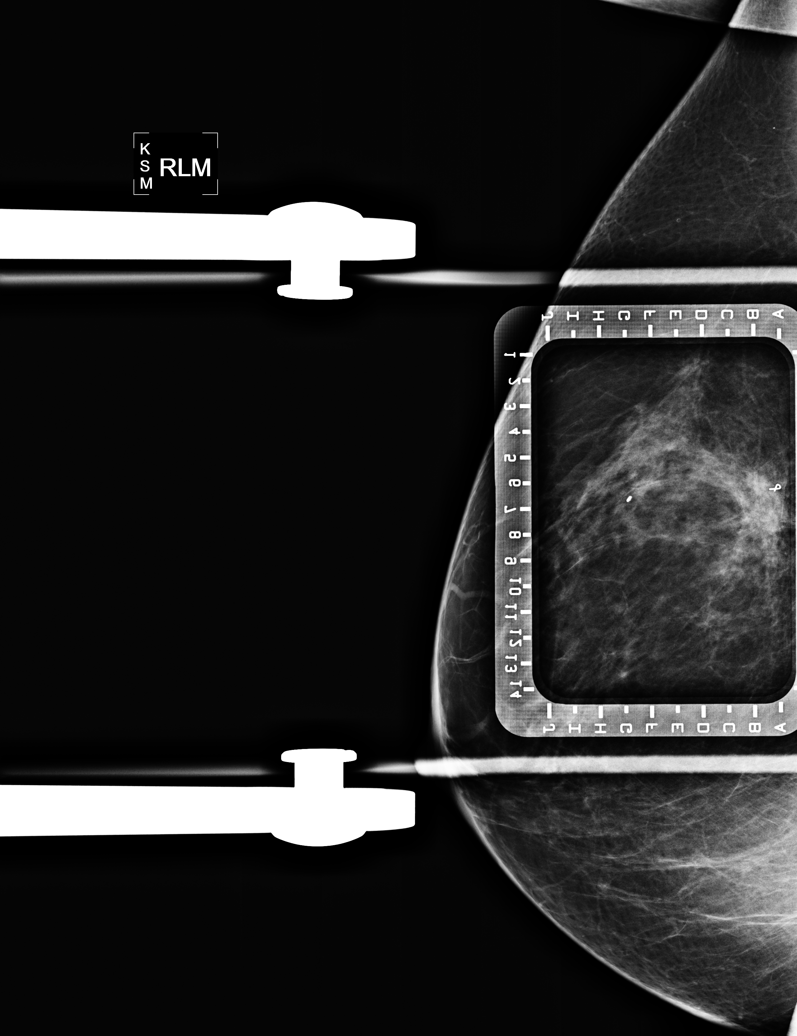

[R LM (2 of 3)]
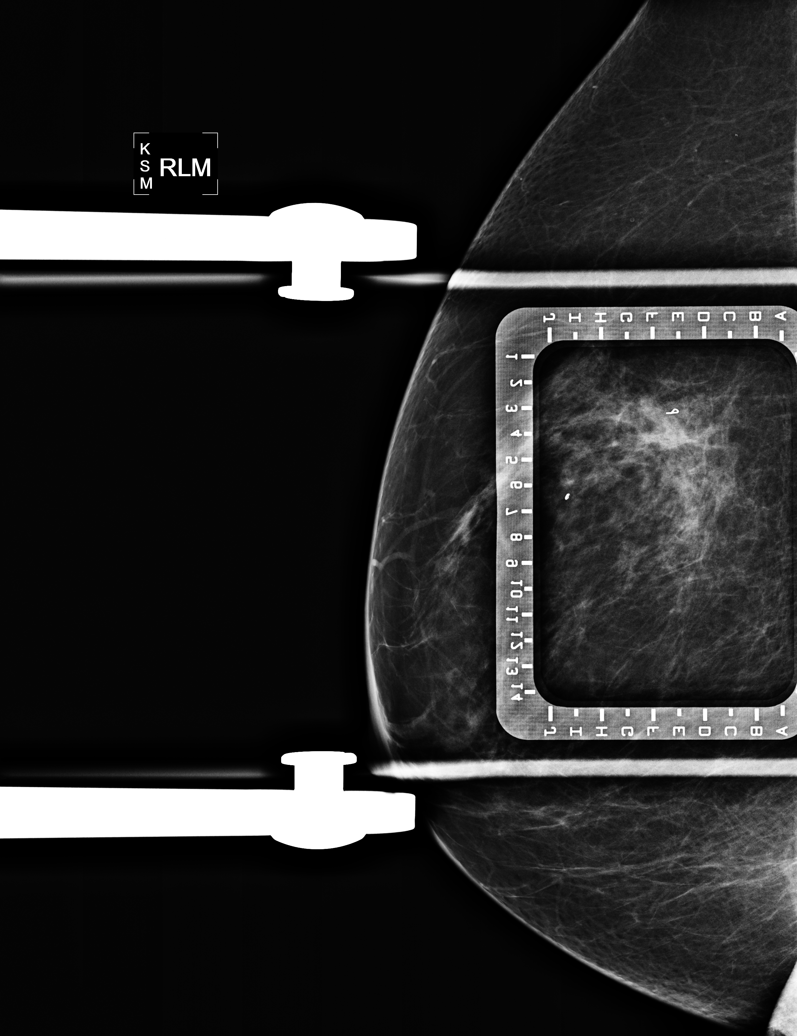

[R CC (1 of 2)]
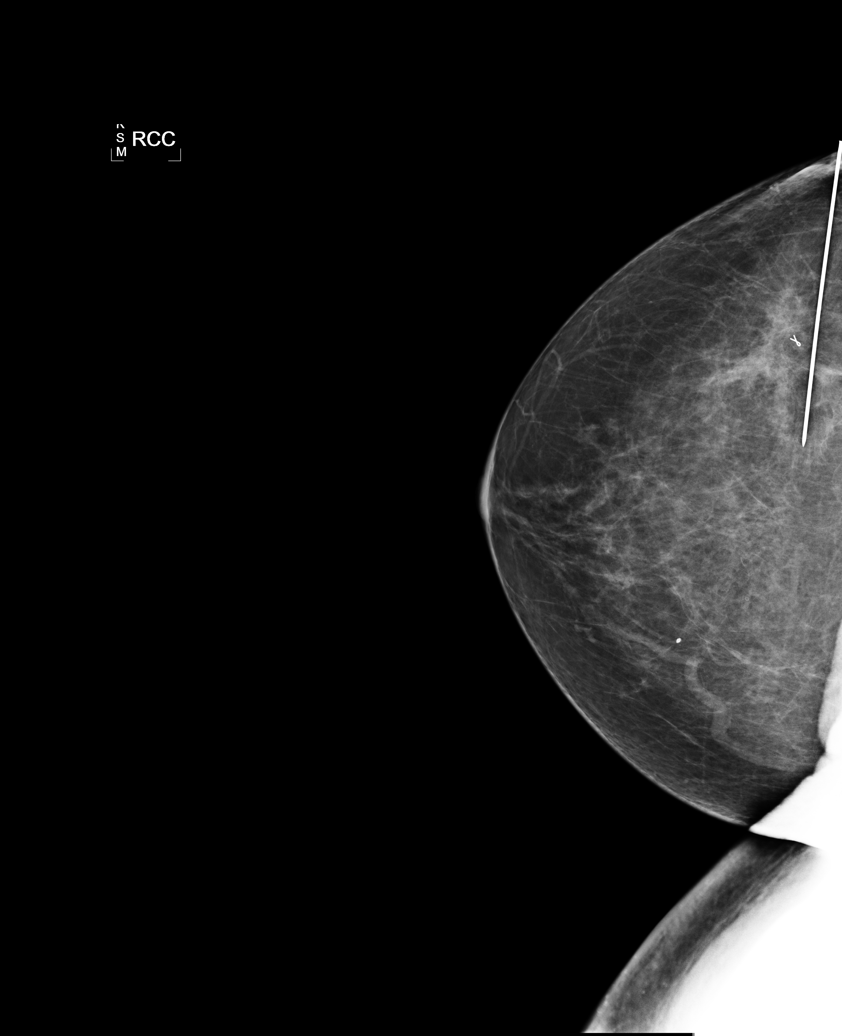

[R CC (2 of 2)]
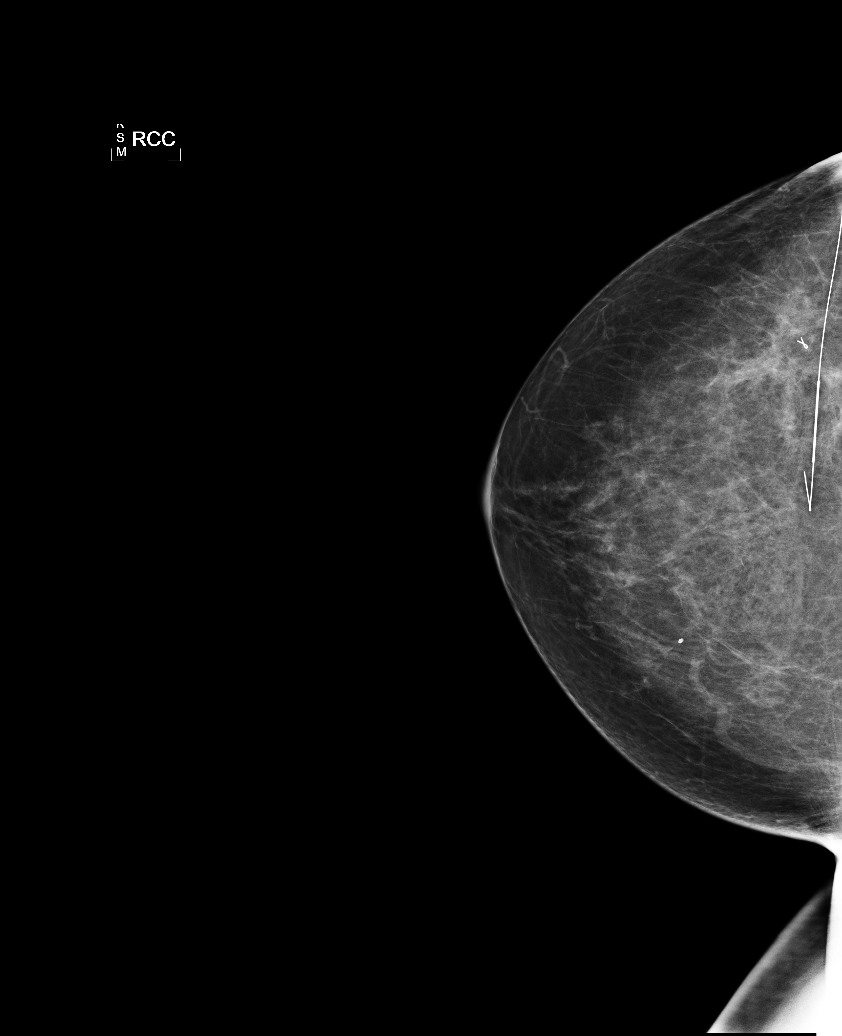

[R LM (3 of 3)]
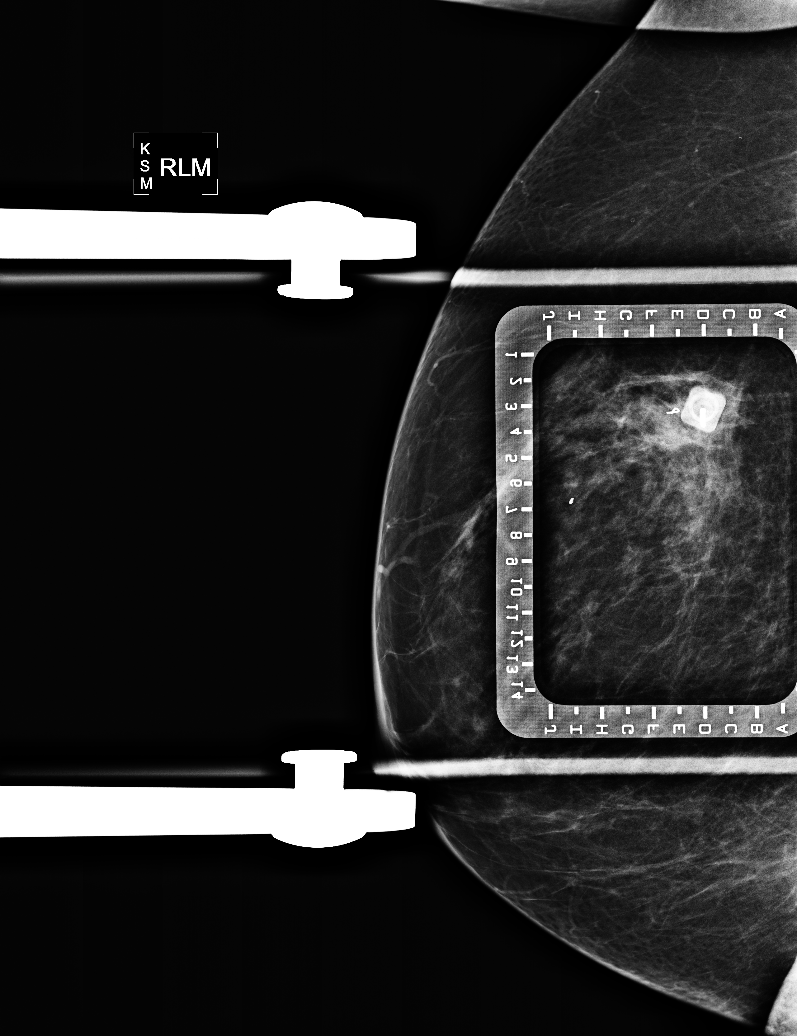

[5 of 5 positions shown; findings below may reference images not displayed]

NEEDLE LOCALIZATION WITH MAMMOGRAPHIC GUIDANCE AND SPECIMEN
RADIOGRAPH

Patient presents for needle localization prior to lumpectomy.  I
met with the patient and we discussed the procedure of needle
localization including risks, benefits, and alternatives.
Specifically, we discussed the risks of infection, bleeding, tissue
injury, and inadequate sampling. Informed written consent was
given.

Using mammographic guidance, sterile technique, 2% lidocaine and a
7 cm modified Kopans needle, the marker clip was localized using a
lateral medial approach.  The films are marked for Dr. Jeinor.

Specimen radiograph was performed at [REDACTED], and confirms marker clip
and wire present in the tissue sample.  The specimen is marked for
pathology.
IMPRESSION: Needle localization right breast.  No apparent complications.

## 2012-05-03 ENCOUNTER — Encounter (HOSPITAL_BASED_OUTPATIENT_CLINIC_OR_DEPARTMENT_OTHER): Payer: Self-pay | Admitting: *Deleted

## 2012-05-03 NOTE — Progress Notes (Addendum)
Bring all medications. Coming Thursday for CMET,CBC, PT, EKG and CXR around 3:30 pm when pt gets off from work. Requested last office note, any cardiac testing from Dr. Kendal Hymen office reguarding mumur.

## 2012-05-06 ENCOUNTER — Encounter (HOSPITAL_BASED_OUTPATIENT_CLINIC_OR_DEPARTMENT_OTHER)
Admission: RE | Admit: 2012-05-06 | Discharge: 2012-05-06 | Disposition: A | Discharge status: Home / Self Care | Payer: BC Managed Care – PPO | Source: Ambulatory Visit | Attending: General Surgery | Admitting: General Surgery

## 2012-05-06 ENCOUNTER — Other Ambulatory Visit (INDEPENDENT_AMBULATORY_CARE_PROVIDER_SITE_OTHER): Payer: Self-pay | Admitting: General Surgery

## 2012-05-06 ENCOUNTER — Ambulatory Visit
Admission: RE | Admit: 2012-05-06 | Discharge: 2012-05-06 | Disposition: A | Discharge status: Home / Self Care | Payer: BC Managed Care – PPO | Source: Ambulatory Visit | Attending: General Surgery | Admitting: General Surgery

## 2012-05-06 DIAGNOSIS — N63 Unspecified lump in unspecified breast: Secondary | ICD-10-CM

## 2012-05-06 LAB — COMPREHENSIVE METABOLIC PANEL
ALT: 16 U/L (ref 0–35)
AST: 22 U/L (ref 0–37)
Albumin: 4.3 g/dL (ref 3.5–5.2)
CO2: 26 mEq/L (ref 19–32)
Chloride: 101 mEq/L (ref 96–112)
GFR calc non Af Amer: 75 mL/min — ABNORMAL LOW (ref >90)
Potassium: 4.3 mEq/L (ref 3.5–5.1)
Sodium: 140 mEq/L (ref 135–145)
Total Bilirubin: 0.6 mg/dL (ref 0.3–1.2)

## 2012-05-06 LAB — CBC
HCT: 37.3 % (ref 36.0–46.0)
Hemoglobin: 12.6 g/dL (ref 12.0–15.0)
MCV: 88.2 fL (ref 78.0–100.0)
RBC: 4.23 MIL/uL (ref 3.87–5.11)
RDW: 13.1 % (ref 11.5–15.5)
WBC: 4.9 10*3/uL (ref 4.0–10.5)

## 2012-05-07 ENCOUNTER — Encounter (HOSPITAL_BASED_OUTPATIENT_CLINIC_OR_DEPARTMENT_OTHER): Payer: Self-pay | Admitting: Certified Registered Nurse Anesthetist

## 2012-05-07 ENCOUNTER — Ambulatory Visit (HOSPITAL_BASED_OUTPATIENT_CLINIC_OR_DEPARTMENT_OTHER): Payer: BC Managed Care – PPO | Admitting: Certified Registered Nurse Anesthetist

## 2012-05-07 ENCOUNTER — Ambulatory Visit (HOSPITAL_BASED_OUTPATIENT_CLINIC_OR_DEPARTMENT_OTHER)
Admission: RE | Admit: 2012-05-07 | Discharge: 2012-05-07 | Disposition: A | Discharge status: Home / Self Care | Payer: BC Managed Care – PPO | Source: Ambulatory Visit | Attending: General Surgery | Admitting: General Surgery

## 2012-05-07 ENCOUNTER — Encounter (HOSPITAL_BASED_OUTPATIENT_CLINIC_OR_DEPARTMENT_OTHER)
Admission: RE | Disposition: A | Discharge status: Home / Self Care | Payer: Self-pay | Source: Ambulatory Visit | Attending: General Surgery

## 2012-05-07 DIAGNOSIS — E119 Type 2 diabetes mellitus without complications: Secondary | ICD-10-CM | POA: Insufficient documentation

## 2012-05-07 DIAGNOSIS — Z01810 Encounter for preprocedural cardiovascular examination: Secondary | ICD-10-CM | POA: Insufficient documentation

## 2012-05-07 DIAGNOSIS — Z853 Personal history of malignant neoplasm of breast: Secondary | ICD-10-CM | POA: Insufficient documentation

## 2012-05-07 DIAGNOSIS — D249 Benign neoplasm of unspecified breast: Secondary | ICD-10-CM

## 2012-05-07 DIAGNOSIS — Z79899 Other long term (current) drug therapy: Secondary | ICD-10-CM | POA: Insufficient documentation

## 2012-05-07 DIAGNOSIS — N63 Unspecified lump in unspecified breast: Secondary | ICD-10-CM | POA: Insufficient documentation

## 2012-05-07 DIAGNOSIS — Z01812 Encounter for preprocedural laboratory examination: Secondary | ICD-10-CM | POA: Insufficient documentation

## 2012-05-07 DIAGNOSIS — Z923 Personal history of irradiation: Secondary | ICD-10-CM | POA: Insufficient documentation

## 2012-05-07 HISTORY — DX: Nausea with vomiting, unspecified: R11.2

## 2012-05-07 HISTORY — PX: MASS EXCISION: SHX2000

## 2012-05-07 HISTORY — DX: Other specified postprocedural states: Z98.890

## 2012-05-07 HISTORY — DX: Cardiac murmur, unspecified: R01.1

## 2012-05-07 HISTORY — DX: Headache: R51

## 2012-05-07 SURGERY — EXCISION MASS
Anesthesia: General | Site: Breast | Laterality: Right | Wound class: Clean

## 2012-05-07 MED ORDER — MORPHINE SULFATE 2 MG/ML IJ SOLN: 2.0000 mg | INTRAMUSCULAR | Status: DC | PRN

## 2012-05-07 MED ORDER — LIDOCAINE HCL (CARDIAC) 20 MG/ML IV SOLN
INTRAVENOUS | Status: DC | PRN
  Administered 2012-05-07: 60 mg via INTRAVENOUS

## 2012-05-07 MED ORDER — CEFAZOLIN SODIUM 1-5 GM-% IV SOLN
1.0000 g | INTRAVENOUS | Status: AC
  Administered 2012-05-07: 1 g via INTRAVENOUS

## 2012-05-07 MED ORDER — METOCLOPRAMIDE HCL 5 MG/ML IJ SOLN
10.0000 mg | Freq: Once | INTRAMUSCULAR | Status: AC | PRN
  Administered 2012-05-07: 10 mg via INTRAVENOUS

## 2012-05-07 MED ORDER — FENTANYL CITRATE 0.05 MG/ML IJ SOLN
INTRAMUSCULAR | Status: DC | PRN
  Administered 2012-05-07: 50 ug via INTRAVENOUS
  Administered 2012-05-07: 25 ug via INTRAVENOUS

## 2012-05-07 MED ORDER — SCOPOLAMINE 1 MG/3DAYS TD PT72
1.0000 | MEDICATED_PATCH | TRANSDERMAL | Status: DC
  Administered 2012-05-07: 1.5 mg via TRANSDERMAL

## 2012-05-07 MED ORDER — SODIUM CHLORIDE 0.9 % IJ SOLN: 3.0000 mL | INTRAMUSCULAR | Status: DC | PRN

## 2012-05-07 MED ORDER — BUPIVACAINE HCL (PF) 0.5 % IJ SOLN
INTRAMUSCULAR | Status: DC | PRN
  Administered 2012-05-07: 10 mL

## 2012-05-07 MED ORDER — ACETAMINOPHEN 325 MG PO TABS: 650.0000 mg | ORAL_TABLET | ORAL | Status: DC | PRN

## 2012-05-07 MED ORDER — MIDAZOLAM HCL 5 MG/5ML IJ SOLN
INTRAMUSCULAR | Status: DC | PRN
  Administered 2012-05-07: 1 mg via INTRAVENOUS

## 2012-05-07 MED ORDER — METOCLOPRAMIDE HCL 5 MG/ML IJ SOLN
INTRAMUSCULAR | Status: DC | PRN
  Administered 2012-05-07: 10 mg via INTRAVENOUS

## 2012-05-07 MED ORDER — HYDROMORPHONE HCL PF 1 MG/ML IJ SOLN: 0.2500 mg | INTRAMUSCULAR | Status: DC | PRN

## 2012-05-07 MED ORDER — DEXAMETHASONE SODIUM PHOSPHATE 4 MG/ML IJ SOLN
INTRAMUSCULAR | Status: DC | PRN
  Administered 2012-05-07: 4 mg via INTRAVENOUS

## 2012-05-07 MED ORDER — OXYCODONE HCL 5 MG PO TABS: 5.0000 mg | ORAL_TABLET | Freq: Once | ORAL | Status: DC | PRN

## 2012-05-07 MED ORDER — ACETAMINOPHEN 650 MG RE SUPP: 650.0000 mg | RECTAL | Status: DC | PRN

## 2012-05-07 MED ORDER — TRAMADOL HCL 50 MG PO TABS: 50.0000 mg | ORAL_TABLET | Freq: Four times a day (QID) | ORAL | Status: AC | PRN

## 2012-05-07 MED ORDER — LACTATED RINGERS IV SOLN
INTRAVENOUS | Status: DC
  Administered 2012-05-07: 07:00:00 via INTRAVENOUS

## 2012-05-07 MED ORDER — PROPOFOL 10 MG/ML IV EMUL
INTRAVENOUS | Status: DC | PRN
  Administered 2012-05-07: 200 mg via INTRAVENOUS

## 2012-05-07 MED ORDER — ACETAMINOPHEN 10 MG/ML IV SOLN: 1000.0000 mg | Freq: Once | INTRAVENOUS | Status: DC

## 2012-05-07 SURGICAL SUPPLY — 46 items
APL SKNCLS STERI-STRIP NONHPOA (GAUZE/BANDAGES/DRESSINGS) ×2
BENZOIN TINCTURE PRP APPL 2/3 (GAUZE/BANDAGES/DRESSINGS) ×2 IMPLANT
BLADE SURG 10 STRL SS (BLADE) ×2 IMPLANT
BLADE SURG 15 STRL LF DISP TIS (BLADE) IMPLANT
BLADE SURG 15 STRL SS (BLADE) ×2
BLADE SURG ROTATE 9660 (MISCELLANEOUS) IMPLANT
CANISTER SUCTION 1200CC (MISCELLANEOUS) IMPLANT
CHLORAPREP W/TINT 26ML (MISCELLANEOUS) ×2 IMPLANT
CLEANER CAUTERY TIP 5X5 PAD (MISCELLANEOUS) ×1 IMPLANT
CLOTH BEACON ORANGE TIMEOUT ST (SAFETY) ×2 IMPLANT
COVER MAYO STAND STRL (DRAPES) ×2 IMPLANT
COVER TABLE BACK 60X90 (DRAPES) ×2 IMPLANT
DECANTER SPIKE VIAL GLASS SM (MISCELLANEOUS) IMPLANT
DRAPE PED LAPAROTOMY (DRAPES) ×2 IMPLANT
DRSG TEGADERM 4X4.75 (GAUZE/BANDAGES/DRESSINGS) ×2 IMPLANT
ELECT REM PT RETURN 9FT ADLT (ELECTROSURGICAL) ×2
ELECTRODE REM PT RTRN 9FT ADLT (ELECTROSURGICAL) ×1 IMPLANT
GAUZE SPONGE 4X4 12PLY STRL LF (GAUZE/BANDAGES/DRESSINGS) ×2 IMPLANT
GAUZE SPONGE 4X4 16PLY XRAY LF (GAUZE/BANDAGES/DRESSINGS) IMPLANT
GLOVE BIO SURGEON STRL SZ7.5 (GLOVE) ×1 IMPLANT
GLOVE BIOGEL PI IND STRL 8 (GLOVE) IMPLANT
GLOVE BIOGEL PI IND STRL 8.5 (GLOVE) ×1 IMPLANT
GLOVE BIOGEL PI INDICATOR 8 (GLOVE) ×1
GLOVE BIOGEL PI INDICATOR 8.5 (GLOVE) ×1
GLOVE ECLIPSE 8.0 STRL XLNG CF (GLOVE) ×2 IMPLANT
GOWN PREVENTION PLUS XLARGE (GOWN DISPOSABLE) ×2 IMPLANT
GOWN PREVENTION PLUS XXLARGE (GOWN DISPOSABLE) ×1 IMPLANT
NDL HYPO 25X1 1.5 SAFETY (NEEDLE) ×1 IMPLANT
NEEDLE HYPO 25X1 1.5 SAFETY (NEEDLE) ×2 IMPLANT
NS IRRIG 1000ML POUR BTL (IV SOLUTION) IMPLANT
PACK BASIN DAY SURGERY FS (CUSTOM PROCEDURE TRAY) ×2 IMPLANT
PAD CLEANER CAUTERY TIP 5X5 (MISCELLANEOUS) ×1
PENCIL BUTTON HOLSTER BLD 10FT (ELECTRODE) ×2 IMPLANT
STRIP CLOSURE SKIN 1/2X4 (GAUZE/BANDAGES/DRESSINGS) ×2 IMPLANT
SUT MON AB 4-0 PC3 18 (SUTURE) ×1 IMPLANT
SUT PROLENE 2 0 CT2 30 (SUTURE) IMPLANT
SUT VIC AB 3-0 SH 27 (SUTURE) ×2
SUT VIC AB 3-0 SH 27X BRD (SUTURE) IMPLANT
SUT VIC AB 4-0 SH 18 (SUTURE) IMPLANT
SUT VIC AB 4-0 SH 27 (SUTURE)
SUT VIC AB 4-0 SH 27XANBCTRL (SUTURE) IMPLANT
SYR CONTROL 10ML LL (SYRINGE) ×2 IMPLANT
TOWEL OR 17X24 6PK STRL BLUE (TOWEL DISPOSABLE) ×3 IMPLANT
TUBE CONNECTING 20X1/4 (TUBING) IMPLANT
WATER STERILE IRR 1000ML POUR (IV SOLUTION) IMPLANT
YANKAUER SUCT BULB TIP NO VENT (SUCTIONS) IMPLANT

## 2012-05-07 NOTE — Anesthesia Procedure Notes (Signed)
Procedure Name: LMA Insertion Date/Time: 05/07/2012 7:38 AM Performed by: Anani Gu D Pre-anesthesia Checklist: Patient identified, Emergency Drugs available, Suction available and Patient being monitored Patient Re-evaluated:Patient Re-evaluated prior to inductionOxygen Delivery Method: Circle System Utilized Preoxygenation: Pre-oxygenation with 100% oxygen Intubation Type: IV induction Ventilation: Mask ventilation without difficulty LMA: LMA inserted LMA Size: 4.0 Number of attempts: 1 Airway Equipment and Method: bite block Placement Confirmation: positive ETCO2 Tube secured with: Tape Dental Injury: Teeth and Oropharynx as per pre-operative assessment

## 2012-05-07 NOTE — Discharge Instructions (Addendum)
[facility] [employer] [facility] Office Phone Number [phone]  BREAST BIOPSY/ PARTIAL MASTECTOMY: POST OP INSTRUCTIONS  Always review your discharge instruction sheet given to you by the facility where your surgery was performed.  IF YOU HAVE DISABILITY OR FAMILY LEAVE FORMS, YOU MUST BRING THEM TO THE OFFICE FOR PROCESSING.  DO NOT GIVE THEM TO YOUR DOCTOR.  1. A prescription for pain medication may be given to you upon discharge.  Take your pain medication as prescribed, if needed.  If narcotic pain medicine is not needed, then you may take acetaminophen (Tylenol) or ibuprofen (Advil) as needed. 2. Take your usually prescribed medications unless otherwise directed 3. If you need a refill on your pain medication, please contact your pharmacy.  They will contact our office to request authorization.  Prescriptions will not be filled after 5pm or on week-ends. 4. You should eat very light the first 24 hours after surgery, such as soup, crackers, pudding, etc.  Resume your normal diet the day after surgery. 5. Most patients will experience some swelling and bruising in the breast.  Ice packs and a good support bra will help.  Swelling and bruising can take several days to resolve.  6. It is common to experience some constipation if taking pain medication after surgery.  Increasing fluid intake and taking a stool softener will usually help or prevent this problem from occurring.  A mild laxative (Milk of Magnesia or Miralax) should be taken according to package directions if there are no bowel movements after 48 hours. 7. Unless discharge instructions indicate otherwise, you may remove your bandages 48 hours after surgery, and you may shower at that time.  You may have steri-strips (small skin tapes) in place directly over the incision.  These strips should be left on the skin .  If your surgeon used skin glue on the incision, you may shower in 24 hours.  The glue will flake off over the next 2-3 weeks.   Any sutures or staples will be removed at the office during your follow-up visit. 8. ACTIVITIES:  You may resume regular daily activities (gradually increasing) beginning the next day.  Wearing a good support bra or sports bra minimizes pain and swelling.  You may have sexual intercourse when it is comfortable. a. You may drive when you no longer are taking prescription pain medication, you can comfortably wear a seatbelt, and you can safely maneuver your car and apply brakes. b. RETURN TO WORK:  ______________________________________________________________________________________ 9. You should see your doctor in the office for a follow-up appointment approximately two weeks after your surgery.  Call to make this appointment.   Expect your pathology report 3 business days after your surgery.  You may call to check if you do not hear from [place] after three days. 10. OTHER INSTRUCTIONS: _______________________________________________________________________________________________ _____________________________________________________________________________________________________________________________________ _____________________________________________________________________________________________________________________________________ _____________________________________________________________________________________________________________________________________  WHEN TO CALL YOUR DOCTOR: 1. Fever over 101.0 2. Nausea and/or vomiting. 3. Extreme swelling or bruising. 4. Continued bleeding from incision. 5. Increased pain, redness, or drainage from the incision.  The clinic staff is available to answer your questions during regular business hours.  Please don't hesitate to call and ask to speak to one of the nurses for clinical concerns.  If you have a medical emergency, go to the nearest emergency room or call 911.  A surgeon from [facility] is always on call at the  hospital.  For further questions, please visit [email or web address]     Post Anesthesia Home Care Instructions  Activity: Get plenty of  rest for the remainder of the day. A responsible adult should stay with you for 24 hours following the procedure.  For the next 24 hours, DO NOT: -Drive a car -Advertising copywriter -Drink alcoholic beverages -Take any medication unless instructed by your physician -Make any legal decisions or sign important papers.  Meals: Start with liquid foods such as gelatin or soup. Progress to regular foods as tolerated. Avoid greasy, spicy, heavy foods. If nausea and/or vomiting occur, drink only clear liquids until the nausea and/or vomiting subsides. Call your physician if vomiting continues.  Special Instructions/Symptoms: Your throat may feel dry or sore from the anesthesia or the breathing tube placed in your throat during surgery. If this causes discomfort, gargle with warm salt water. The discomfort should disappear within 24 hours.

## 2012-05-07 NOTE — Op Note (Signed)
Operative Note  Grace Carroll Grace Carroll female 61 y.o. 05/07/2012  PREOPERATIVE DX:  Right lateral breast mass  POSTOPERATIVE DX:  Same  PROCEDURE:  Excision of right breast mass         Surgeon: Adolph Pollack   Assistants: None  Anesthesia: General LMA anesthesia  Indications:  This is a 61 year old female who is status post right partial mastectomy and sentinel lymph node biopsy June of 2012 for right breast cancer. She underwent chemotherapy and radiation therapy as well. Recently, a mass was noted in the far lateral aspect of the right breast. Ultrasound demonstrated a combination of a cystic and solid mass. She now presents for removal.    Procedure Detail:  She was seen in the holding area in the right breast marked with my initials. She is brought to the operating room placed supine on the operating table and a general anesthetic with a LMA was given. The right lateral breast mass was marked and the area was sterilely prepped and draped.  I made a transverse incision directly over the mass through the skin and dermis. Using sharp dissection I excised a firm mass down to the level of normal tissue. I did enter a seroma area from the previous partial mastectomy site and drained this. The mass was sent to pathology.  Bleeding from the biopsy site was controlled with electrocautery. Marcaine solution was injected for local anesthetic effect.  Once hemostasis was adequate the subcutaneous tissue was approximated with interrupted 3-0 Vicryl sutures.  The skin was closed with a running 4-0 Monocryl subcuticular stitch. Steri-Strips and sterile dressing were applied.  She tolerated the procedure well without any apparent complications and was taken to the recovery room in satisfactory condition.   Estimated Blood Loss:  less than 100 mL         Drains: none         Blood Given: none          Specimens: Right breast mass        Complications:  * No complications entered in OR log *        Disposition: PACU - hemodynamically stable.         Condition: stable

## 2012-05-07 NOTE — H&P (Signed)
Grace Carroll is an 61 y.o. female.   Chief Complaint: Here for elective removal of right breast mass HPI: She underwent right partial mastectomy for breast cancer in June 2012. She had reexcision of the biopsy site July 2012. A mass is been discovered was an indentation laterally in the right breast. It appears to be partially cystic and partially solid. It has been persistent. She now presents for removal.  Past Medical History  Diagnosis Date  . Ulcer of the stomach and intestine     per h&p Dr Grace Carroll 04/02/11  . Cancer right breast  . Breast cancer     l breast lumpectomy- xrt 1992  . Fatigue 11/04/2011  . Heart murmur     noted 2 years ago  . Diabetes mellitus     diet controlled diabetic  . Headache     occasional migraines- takes tylenol  . PONV (postoperative nausea and vomiting)     Past Surgical History  Procedure Date  . Appendectomy   . Tubal ligation   . Breast lumpectomy      left and right; right slnbx  . Carpal tunnel release     bilateral  . Shoulder arthroscopy     bone spurs   . Port-a-cath removal 11/20/2011    Family History  Problem Relation Age of Onset  . Cancer Mother     cervical  . Heart disease Mother   . Heart disease Father   . Cancer Sister     lung and brain  . Cancer Sister 52    breast  . Cancer Sister     colon  . Cancer Brother     brain   Social History:  reports that she has never smoked. She has never used smokeless tobacco. She reports that she does not drink alcohol or use illicit drugs.  Allergies:  Allergies  Allergen Reactions  . Codeine Nausea And Vomiting    REACTION: nausea  . Zofran Anaphylaxis  . Gluten Meal Diarrhea    Medications Prior to Admission  Medication Sig Dispense Refill  . alendronate (FOSAMAX) 70 MG tablet Take 1 tablet (70 mg total) by mouth every 7 (seven) days. Take with a full glass of water on an empty stomach.  12 tablet  6  . anastrozole (ARIMIDEX) 1 MG tablet Take 1 mg by mouth daily.  01/20/12 restarted      . Calcium Carbonate-Vitamin D (RA CALCIUM PLUS VITAMIN D) 600-200 MG-UNIT TABS Take by mouth daily.      . cholecalciferol (VITAMIN D) 400 UNITS TABS Take 4,000 Units by mouth daily.       . ranitidine (ZANTAC) 300 MG tablet Take 300 mg by mouth as needed.         Results for orders placed during the hospital encounter of 05/07/12 (from the past 48 hour(s))  CBC     Status: Normal   Collection Time   05/06/12  3:25 PM      Component Value Range Comment   WBC 4.9  4.0 - 10.5 (K/uL)    RBC 4.23  3.87 - 5.11 (MIL/uL)    Hemoglobin 12.6  12.0 - 15.0 (g/dL)    HCT 40.9  81.1 - 91.4 (%)    MCV 88.2  78.0 - 100.0 (fL)    MCH 29.8  26.0 - 34.0 (pg)    MCHC 33.8  30.0 - 36.0 (g/dL)    RDW 78.2  95.6 - 21.3 (%)    Platelets 198  150 - 400 (K/uL)   COMPREHENSIVE METABOLIC PANEL     Status: Abnormal   Collection Time   05/06/12  3:25 PM      Component Value Range Comment   Sodium 140  135 - 145 (mEq/L)    Potassium 4.3  3.5 - 5.1 (mEq/L)    Chloride 101  96 - 112 (mEq/L)    CO2 26  19 - 32 (mEq/L)    Glucose, Bld 98  70 - 99 (mg/dL)    BUN 20  6 - 23 (mg/dL)    Creatinine, Ser 4.54  0.50 - 1.10 (mg/dL)    Calcium 9.6  8.4 - 10.5 (mg/dL)    Total Protein 6.8  6.0 - 8.3 (g/dL)    Albumin 4.3  3.5 - 5.2 (g/dL)    AST 22  0 - 37 (U/L)    ALT 16  0 - 35 (U/L)    Alkaline Phosphatase 90  39 - 117 (U/L)    Total Bilirubin 0.6  0.3 - 1.2 (mg/dL)    GFR calc non Af Amer 75 (*) >90 (mL/min)    GFR calc Af Amer 87 (*) >90 (mL/min)   PROTIME-INR     Status: Normal   Collection Time   05/06/12  3:25 PM      Component Value Range Comment   Prothrombin Time 11.7  11.6 - 15.2 (seconds)    INR 0.84  0.00 - 1.49     Dg Chest 2 View  05/06/2012  *RADIOLOGY REPORT*  Clinical Data: Palpable right breast mass.  History of breast cancer.  Preoperative evaluation.  CHEST - 2 VIEW  Comparison: 06/09/2011.  Findings: Normal sized heart.  Clear lungs.  Mild scoliosis.  Mild thoracic spine  degenerative changes.  IMPRESSION: No acute abnormality.  Original Report Authenticated By: Darrol Angel, M.D.    ROS  Height 5\' 8"  (1.727 m), weight 156 lb (70.761 kg). Physical Exam  Constitutional: She appears well-developed and well-nourished.  HENT:  Head: Normocephalic and atraumatic.  Eyes: No scleral icterus.  Neck: Neck supple.  Cardiovascular: Normal rate and regular rhythm.   Respiratory: Effort normal and breath sounds normal.       Right breast scar. Laterally, there is an area of firmness and indentation present on the right side.  Right upper chest scar at previous port-a-cath site.  Musculoskeletal: She exhibits no edema.  Lymphadenopathy:    She has no cervical adenopathy.     Assessment/Plan Persistent right breast mass on side of previous breast cancer.  Plan:  Excision of right breast mass.  The procedure and risks have been discussed with her.  Grace Carroll J 05/07/2012, 7:15 AM

## 2012-05-07 NOTE — Interval H&P Note (Signed)
History and Physical Interval Note:  05/07/2012 7:22 AM  Grace Carroll  has presented today for surgery, with the diagnosis of right breast mass  The various methods of treatment have been discussed with the patient and family. After consideration of risks, benefits and other options for treatment, the patient has consented to  Procedure(s) (LRB): EXCISION MASS (Right) as a surgical intervention .  The patients' history has been reviewed, patient examined, no change in status, stable for surgery.  I have reviewed the patients' chart and labs.  Questions were answered to the patient's satisfaction.     Josette Shimabukuro Shela [facility]

## 2012-05-07 NOTE — Anesthesia Preprocedure Evaluation (Signed)
Anesthesia Evaluation  Patient identified by MRN, date of birth, ID band Patient awake    Reviewed: Allergy & Precautions, H&P , NPO status , Patient's Chart, lab work & pertinent test results, reviewed documented beta blocker date and time   History of Anesthesia Complications (+) PONV  Airway Mallampati: II TM Distance: >3 FB Neck ROM: full    Dental   Pulmonary neg pulmonary ROS,          Cardiovascular + Valvular Problems/Murmurs     Neuro/Psych  Headaches, negative psych ROS   GI/Hepatic Neg liver ROS, PUD,   Endo/Other  Diabetes mellitus-  Renal/GU negative Renal ROS  negative genitourinary   Musculoskeletal   Abdominal   Peds  Hematology negative hematology ROS (+)   Anesthesia Other Findings See surgeon's H&P   Reproductive/Obstetrics negative OB ROS                           Anesthesia Physical Anesthesia Plan  ASA: II  Anesthesia Plan: General   Post-op Pain Management:    Induction: Intravenous  Airway Management Planned: LMA  Additional Equipment:   Intra-op Plan:   Post-operative Plan: Extubation in OR  Informed Consent: I have reviewed the patients History and Physical, chart, labs and discussed the procedure including the risks, benefits and alternatives for the proposed anesthesia with the patient or authorized representative who has indicated his/her understanding and acceptance.   Dental Advisory Given  Plan Discussed with: CRNA and Surgeon  Anesthesia Plan Comments:         Anesthesia Quick Evaluation

## 2012-05-07 NOTE — Transfer of Care (Signed)
Immediate Anesthesia Transfer of Care Note  Patient: Grace Carroll  Procedure(s) Performed: Procedure(s) (LRB): EXCISION MASS (Right)  Patient Location: PACU  Anesthesia Type: General  Level of Consciousness: awake, alert , oriented and patient cooperative  Airway & Oxygen Therapy: Patient Spontanous Breathing and Patient connected to face mask oxygen  Post-op Assessment: Report given to PACU RN and Post -op Vital signs reviewed and stable  Post vital signs: Reviewed and stable  Complications: No apparent anesthesia complications

## 2012-05-07 NOTE — Anesthesia Postprocedure Evaluation (Signed)
Anesthesia Post Note  Patient: Grace Carroll  Procedure(s) Performed: Procedure(s) (LRB): EXCISION MASS (Right)  Anesthesia type: General  Patient location: PACU  Post pain: Pain level controlled  Post assessment: Patient's Cardiovascular Status Stable  Last Vitals:  Filed Vitals:   05/07/12 0922  BP: 146/70  Pulse: 80  Temp: 36.7 C  Resp: 16    Post vital signs: Reviewed and stable  Level of consciousness: alert  Complications: No apparent anesthesia complications

## 2012-05-10 ENCOUNTER — Encounter (HOSPITAL_BASED_OUTPATIENT_CLINIC_OR_DEPARTMENT_OTHER): Payer: Self-pay | Admitting: General Surgery

## 2012-05-17 ENCOUNTER — Encounter (INDEPENDENT_AMBULATORY_CARE_PROVIDER_SITE_OTHER): Payer: Self-pay | Admitting: General Surgery

## 2012-05-17 ENCOUNTER — Ambulatory Visit (INDEPENDENT_AMBULATORY_CARE_PROVIDER_SITE_OTHER): Payer: BC Managed Care – PPO | Admitting: General Surgery

## 2012-05-17 VITALS — BP 122/68 | HR 72 | Temp 97.4°F | Resp 14 | Ht 68.0 in | Wt 157.5 lb

## 2012-05-17 DIAGNOSIS — Z9889 Other specified postprocedural states: Secondary | ICD-10-CM

## 2012-05-17 NOTE — Patient Instructions (Signed)
Apply moist heat to areas of chest wall soreness.

## 2012-05-17 NOTE — Progress Notes (Signed)
She is here for post operative visit following removal of a right breast mass which was benign. She's develop some chest wall soreness around the Port-A-Cath site recently. She has not had any trouble with the right lateral breast incision.  Physical exam:  Right breast and chest wall-lateral incision is clean and intact. Previous Port-A-Cath site scar is clean and intact with minimal tenderness and no erythema.  Assessment: Right breast mass is benign in the side where she had breast conservation therapy for breast cancer. Some mild chest wall tenderness.  Plan: Warm moist heat to the areas of chest wall tenderness. Return visit 3 months.

## 2012-05-21 ENCOUNTER — Telehealth: Payer: Self-pay | Admitting: Oncology

## 2012-05-21 ENCOUNTER — Other Ambulatory Visit (HOSPITAL_BASED_OUTPATIENT_CLINIC_OR_DEPARTMENT_OTHER): Payer: BC Managed Care – PPO | Admitting: Lab

## 2012-05-21 ENCOUNTER — Encounter: Payer: Self-pay | Admitting: Oncology

## 2012-05-21 ENCOUNTER — Ambulatory Visit (HOSPITAL_BASED_OUTPATIENT_CLINIC_OR_DEPARTMENT_OTHER): Payer: BC Managed Care – PPO | Admitting: Oncology

## 2012-05-21 VITALS — BP 124/72 | HR 83 | Temp 98.4°F | Ht 68.0 in | Wt 157.9 lb

## 2012-05-21 DIAGNOSIS — M810 Age-related osteoporosis without current pathological fracture: Secondary | ICD-10-CM

## 2012-05-21 DIAGNOSIS — C50919 Malignant neoplasm of unspecified site of unspecified female breast: Secondary | ICD-10-CM

## 2012-05-21 DIAGNOSIS — C50419 Malignant neoplasm of upper-outer quadrant of unspecified female breast: Secondary | ICD-10-CM

## 2012-05-21 LAB — COMPREHENSIVE METABOLIC PANEL
Alkaline Phosphatase: 81 U/L (ref 39–117)
BUN: 14 mg/dL (ref 6–23)
Glucose, Bld: 78 mg/dL (ref 70–99)
Total Bilirubin: 0.6 mg/dL (ref 0.3–1.2)

## 2012-05-21 LAB — CBC WITH DIFFERENTIAL/PLATELET
Basophils Absolute: 0.1 10*3/uL (ref 0.0–0.1)
Eosinophils Absolute: 0.2 10*3/uL (ref 0.0–0.5)
HGB: 13.3 g/dL (ref 11.6–15.9)
LYMPH%: 21.2 % (ref 14.0–49.7)
MCV: 90.2 fL (ref 79.5–101.0)
MONO%: 6.9 % (ref 0.0–14.0)
NEUT#: 3.3 10*3/uL (ref 1.5–6.5)
Platelets: 213 10*3/uL (ref 145–400)
RBC: 4.35 10*6/uL (ref 3.70–5.45)

## 2012-05-21 NOTE — Progress Notes (Signed)
OFFICE PROGRESS NOTE  Dr. Avel Peace  Dr. 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  PRIOR THERAPY:  #1 patient has a history of left breast cancer for which she received lumpectomy with sentinel node biopsy followed by chemotherapy and radiation.  #2 she was then diagnosed with right breast cancer in May 2012. 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 She underwent a lumpectomy with sentinel node biopsy on 05/08/2011 one sentinel node was positive for metastatic disease. The tumor was estrogen receptor positive progesterone receptor negative HER-2/neu negative.  #3 she underwent adjuvant chemotherapy consisting of Taxotere and Cytoxan administered from 06/26/2011 to 08/28/2011 for a total of 4 cycles.  #4 patient completed radiation therapy to the right breast on 10/22/2011.  #5 patient will now begin adjuvant antiestrogen therapy consisting of Arimidex 1 mg daily. We did order a bone density scan today. She is also recommended to start vitamin D  #6 survivorship issues have been discussed with the patient  CURRENT THERAPY: Patient to begin Arimidex 1 mg daily since 11/04/11  INTERVAL HISTORY: Grace Carroll 61 y.o. female returns for followup visit. Overall patient is doing well. She was begun on Fosamax she is tolerating it well. She is back on Arimidex without any significant complaints. She is denying any fevers chills night sweats headaches no fatigue she does occasionally have hot flashes. She does have some arthritic type pains but they're much improved in comparison to her prior visit. And very much tolerable. She has no bleeding problems no vaginal discharge. No difficulty with ambulation. No reflux symptoms. Remainder of the 10 point review of systems is  negative. MEDICAL HISTORY: Past Medical History  Diagnosis Date  . Ulcer of the stomach and intestine     per h&p Dr [place] Scrape 04/02/11  . Cancer right breast  . Breast cancer     l breast lumpectomy- xrt 1992  . Fatigue 11/04/2011  . Heart murmur     noted 2 years ago  . Diabetes mellitus     diet controlled diabetic  . Headache     occasional migraines- takes tylenol  . PONV (postoperative nausea and vomiting)     ALLERGIES:  is allergic to codeine; zofran; and gluten meal.  MEDICATIONS:  Current Outpatient Prescriptions  Medication Sig Dispense Refill  . alendronate (FOSAMAX) 70 MG tablet Take 1 tablet (70 mg total) by mouth every 7 (seven) days. Take with a full glass of water on an empty stomach.  12 tablet  6  . anastrozole (ARIMIDEX) 1 MG tablet Take 1 mg by mouth daily. 01/20/12 restarted      . Calcium Carbonate-Vitamin D (RA CALCIUM PLUS VITAMIN D) 600-200 MG-UNIT TABS Take by mouth daily.      . cholecalciferol (VITAMIN D) 400 UNITS TABS Take 4,000 Units by mouth daily.       . ranitidine (ZANTAC) 300 MG tablet Take 300 mg by mouth as needed.       . traMADol (ULTRAM) 50 MG tablet Take 1 tablet (50 mg total) by mouth every 6 (six) hours as needed for pain.  20 tablet  0    SURGICAL HISTORY:  Past Surgical History  Procedure Date  . Appendectomy   . Tubal ligation   . Breast lumpectomy      left  and right; right slnbx  . Carpal tunnel release     bilateral  . Shoulder arthroscopy     bone spurs   . Port-a-cath removal 11/20/2011  . Mass excision 05/07/2012    Procedure: EXCISION MASS;  Surgeon: Adolph Pollack, MD;  Location: [facility];  Service: General;  Laterality: Right;  remove right breast mass    REVIEW OF SYSTEMS:  Pertinent items are noted in HPI.   PHYSICAL EXAMINATION: General appearance: alert, cooperative and appears stated age Head: Normocephalic, without obvious abnormality, atraumatic Neck: no adenopathy, no carotid bruit, no JVD,  supple, symmetrical, trachea midline and thyroid not enlarged, symmetric, no tenderness/mass/nodules Lymph nodes: Cervical, supraclavicular, and axillary nodes normal. Resp: clear to auscultation bilaterally and normal percussion bilaterally Back: symmetric, no curvature. ROM normal. No CVA tenderness. Cardio: regular rate and rhythm, S1, S2 normal, no murmur, click, rub or gallop and normal apical impulse GI: soft, non-tender; bowel sounds normal; no masses,  no organomegaly Extremities: extremities normal, atraumatic, no cyanosis or edema Neurologic: Alert and oriented X 3, normal strength and tone. Normal symmetric reflexes. Normal coordination and gait Bilateral breast examinations are performed. She is noted to have bilateral surgical scars. Right breast reveals some erythema due to her radiation therapy recently completed. He is no evidence of any masses nipple discharge or other skin changes. Left breast also reveals well-healed surgical scar no masses or nipple discharge. ECOG PERFORMANCE STATUS: 1 - Symptomatic but completely ambulatory  Blood pressure 124/72, pulse 83, temperature 98.4 F (36.9 C), temperature source Oral, height 5\' 8"  (1.727 m), weight 157 lb 14.4 oz (71.623 kg).  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  Patient's tumor was ER positive PR negative HER-2/neu negative. She is now status post adjuvant chemotherapy consisting of 4 cycles of Taxotere and Cytoxan administered from 06/26/2011 to 08/28/2011 for a total of 4 cycles. She has also completed radiation therapy to the right breast on 10/22/2011.  #2 patient also has a history of left breast cancer previously.  #3 patient had genetic testing performed and she was negative for the BRCA1 and 2 gene mutation.  #4 Patient on Arimidex since 11/04/11   #4 Osteopenia/Osteoporosis  PLAN:   #1 patient on  adjuvant antiestrogen therapy consisting of Arimidex 1 mg daily. But held for the past 1 week due to possible side effects But no improvement in her symptoms so likely the symptoms not from the arimidex.. Recommend going back on the arimidex. Call me if the complaints begin again then we will have to switch her to a different agent  2 overall patient is doing well she will  continue Arimidex as well as Fosamax.  #3 I will plan on seeing the patient back in 6 months time for followup.  All questions were answered. The patient knows to call the clinic with any problems, questions or concerns. We can certainly see the patient much sooner if necessary.  I spent 25 minutes counseling the patient face to face. The total time spent in the appointment was 30 minutes.    Drue Second, MD Medical/Oncology [facility] [phone] (beeper) [phone] (Office)  05/21/2012, 11:27 AM

## 2012-05-21 NOTE — Telephone Encounter (Signed)
gve the pt her jan 2014 appt calendar °

## 2012-05-21 NOTE — Patient Instructions (Addendum)
1. Doing well. Continue the arimidex daily as you are  2. I will see you back in Jan 2014

## 2012-08-13 ENCOUNTER — Encounter (INDEPENDENT_AMBULATORY_CARE_PROVIDER_SITE_OTHER): Payer: BC Managed Care – PPO | Admitting: General Surgery

## 2012-08-23 ENCOUNTER — Encounter (INDEPENDENT_AMBULATORY_CARE_PROVIDER_SITE_OTHER): Payer: Self-pay | Admitting: General Surgery

## 2012-08-23 ENCOUNTER — Ambulatory Visit (INDEPENDENT_AMBULATORY_CARE_PROVIDER_SITE_OTHER): Payer: BC Managed Care – PPO | Admitting: General Surgery

## 2012-08-23 VITALS — BP 100/64 | HR 84 | Temp 97.6°F | Resp 16 | Ht 68.0 in | Wt 161.4 lb

## 2012-08-23 DIAGNOSIS — Z853 Personal history of malignant neoplasm of breast: Secondary | ICD-10-CM

## 2012-08-23 NOTE — Patient Instructions (Signed)
Call if you find any new masses in your breasts. 

## 2012-08-23 NOTE — Progress Notes (Signed)
Operation:  Right partial mastectomy, right axillary sentinel lymph node biopsy. Reexcision right partial mastectomy site, Port-A-Cath insertion.  Date:  May 07, 2011. Second operation June 09, 2011.  Pathology: T2 N1a  HPI:  She is here for follow up of her breast cancer. She is taking Arimidex.  She denies any masses in her breasts or adenopathy.  Physical Exam: Right breast-upper outer and lateral scars are present; no masses are felt.  Left breast- Lateral scar is noted; no dominant masses present.  Lymph nodes-no axillary, supraclavicular, or cervical adenopathy. Assessment:  T2 N1 right breast cancer-no clinical evidence of recurrence. Plan: RTC 3 months.

## 2012-11-18 ENCOUNTER — Other Ambulatory Visit: Payer: Self-pay | Admitting: Oncology

## 2012-11-29 ENCOUNTER — Encounter (INDEPENDENT_AMBULATORY_CARE_PROVIDER_SITE_OTHER): Payer: BC Managed Care – PPO | Admitting: General Surgery

## 2012-11-29 ENCOUNTER — Encounter (INDEPENDENT_AMBULATORY_CARE_PROVIDER_SITE_OTHER): Payer: Self-pay | Admitting: General Surgery

## 2012-11-29 ENCOUNTER — Ambulatory Visit (INDEPENDENT_AMBULATORY_CARE_PROVIDER_SITE_OTHER): Payer: BC Managed Care – PPO | Admitting: General Surgery

## 2012-11-29 VITALS — BP 138/70 | HR 84 | Temp 97.6°F | Resp 12 | Ht 68.0 in | Wt 158.6 lb

## 2012-11-29 DIAGNOSIS — Z853 Personal history of malignant neoplasm of breast: Secondary | ICD-10-CM

## 2012-11-29 NOTE — Progress Notes (Signed)
Operation:  Right partial mastectomy, right axillary sentinel lymph node biopsy. Reexcision right partial mastectomy site, Port-A-Cath insertion.  Date:  May 07, 2011. Second operation June 09, 2011.  Pathology: T2 N1a  HPI:  She is here for long-term follow up of her breast cancer. She is taking Arimidex and tolerates it well as long as she takes it in the morning.  She denies any masses in her breasts or adenopathy.  Physical Exam: Right breast-upper outer and lateral scars are present; no masses are felt.  Left breast- Lateral scar is noted; no dominant masses present.  Lymph nodes-no axillary, supraclavicular, or cervical adenopathy. Assessment:  T2 N1 right breast cancer-no clinical evidence of recurrence. Plan: RTC 3 months.

## 2012-11-29 NOTE — Patient Instructions (Signed)
Call if you find any new masses in your breasts. 

## 2012-12-16 ENCOUNTER — Encounter: Payer: Self-pay | Admitting: Oncology

## 2012-12-16 ENCOUNTER — Ambulatory Visit (HOSPITAL_BASED_OUTPATIENT_CLINIC_OR_DEPARTMENT_OTHER): Payer: BC Managed Care – PPO | Admitting: Oncology

## 2012-12-16 ENCOUNTER — Other Ambulatory Visit (HOSPITAL_BASED_OUTPATIENT_CLINIC_OR_DEPARTMENT_OTHER): Payer: BC Managed Care – PPO | Admitting: Lab

## 2012-12-16 VITALS — BP 121/70 | HR 81 | Temp 98.3°F | Resp 20 | Ht 68.0 in | Wt 161.0 lb

## 2012-12-16 DIAGNOSIS — C50419 Malignant neoplasm of upper-outer quadrant of unspecified female breast: Secondary | ICD-10-CM

## 2012-12-16 DIAGNOSIS — Z170 Estrogen receptor positive status [ER+]: Secondary | ICD-10-CM

## 2012-12-16 DIAGNOSIS — M810 Age-related osteoporosis without current pathological fracture: Secondary | ICD-10-CM

## 2012-12-16 DIAGNOSIS — C50919 Malignant neoplasm of unspecified site of unspecified female breast: Secondary | ICD-10-CM

## 2012-12-16 LAB — CBC WITH DIFFERENTIAL/PLATELET
Basophils Absolute: 0 10*3/uL (ref 0.0–0.1)
Eosinophils Absolute: 0.1 10*3/uL (ref 0.0–0.5)
HGB: 12.4 g/dL (ref 11.6–15.9)
LYMPH%: 20.9 % (ref 14.0–49.7)
MCV: 88.7 fL (ref 79.5–101.0)
MONO%: 6.7 % (ref 0.0–14.0)
NEUT#: 4 10*3/uL (ref 1.5–6.5)
Platelets: 204 10*3/uL (ref 145–400)

## 2012-12-16 LAB — COMPREHENSIVE METABOLIC PANEL (CC13)
CO2: 26 mEq/L (ref 22–29)
Glucose: 100 mg/dl — ABNORMAL HIGH (ref 70–99)
Sodium: 140 mEq/L (ref 136–145)
Total Bilirubin: 0.48 mg/dL (ref 0.20–1.20)
Total Protein: 6.7 g/dL (ref 6.4–8.3)

## 2012-12-16 NOTE — Patient Instructions (Addendum)
Continue the arimidex daily  I will see you back in 6 months

## 2012-12-16 NOTE — Progress Notes (Signed)
OFFICE PROGRESS NOTE  Dr. Avel Peace  Dr. 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  PRIOR THERAPY:  #1 patient has a history of left breast cancer for which she received lumpectomy with sentinel node biopsy followed by chemotherapy and radiation.  #2 she was then diagnosed with right breast cancer in May 2012. 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 She underwent a lumpectomy with sentinel node biopsy on 05/08/2011 one sentinel node was positive for metastatic disease. The tumor was estrogen receptor positive progesterone receptor negative HER-2/neu negative.  #3 she underwent adjuvant chemotherapy consisting of Taxotere and Cytoxan administered from 06/26/2011 to 08/28/2011 for a total of 4 cycles.  #4 patient completed radiation therapy to the right breast on 10/22/2011.  #5 patient will now begin adjuvant antiestrogen therapy consisting of Arimidex 1 mg daily. We did order a bone density scan today. She is also recommended to start vitamin D  #6 survivorship issues have been discussed with the patient  CURRENT THERAPY: Patient to begin Arimidex 1 mg daily since 11/04/11  INTERVAL HISTORY: Grace Carroll 62 y.o. female returns for followup visit. Overall patient is doing well. She was begun on Fosamax she is tolerating it well. She is back on Arimidex without any significant complaints. She is denying any fevers chills night sweats headaches no fatigue she does occasionally have hot flashes. She does have some arthritic type pains but they're much improved in comparison to her prior visit. And very much tolerable. She has no bleeding problems no vaginal discharge. No difficulty with ambulation. No reflux symptoms. Remainder of the 10 point review of systems is  negative. MEDICAL HISTORY: Past Medical History  Diagnosis Date  . Ulcer of the stomach and intestine     per h&p Dr Dayton Scrape 04/02/11  . Cancer right breast  . Breast cancer     l breast lumpectomy- xrt 1992  . Fatigue 11/04/2011  . Heart murmur     noted 2 years ago  . Diabetes mellitus     diet controlled diabetic  . Headache     occasional migraines- takes tylenol  . PONV (postoperative nausea and vomiting)     ALLERGIES:  is allergic to codeine; zofran; and gluten meal.  MEDICATIONS:  Current Outpatient Prescriptions  Medication Sig Dispense Refill  . anastrozole (ARIMIDEX) 1 MG tablet Take 1 mg by mouth daily. 01/20/12 restarted      . anastrozole (ARIMIDEX) 1 MG tablet TAKE 1 TABLET BY MOUTH DAILY.  30 tablet  8  . Calcium Carbonate-Vitamin D (RA CALCIUM PLUS VITAMIN D) 600-200 MG-UNIT TABS Take by mouth daily.      . cholecalciferol (VITAMIN D) 400 UNITS TABS Take 4,000 Units by mouth daily.       . ranitidine (ZANTAC) 300 MG tablet Take 300 mg by mouth as needed.       . traMADol (ULTRAM) 50 MG tablet Take 1 tablet (50 mg total) by mouth every 6 (six) hours as needed for pain.  20 tablet  0    SURGICAL HISTORY:  Past Surgical History  Procedure Date  . Appendectomy   . Tubal ligation   . Breast lumpectomy      left and right; right slnbx  . Carpal tunnel release     bilateral  . Shoulder  arthroscopy     bone spurs   . Port-a-cath removal 11/20/2011  . Mass excision 05/07/2012    Procedure: EXCISION MASS;  Surgeon: Adolph Pollack, MD;  Location: [facility];  Service: General;  Laterality: Right;  remove right breast mass    REVIEW OF SYSTEMS:  Pertinent items are noted in HPI.   PHYSICAL EXAMINATION: General appearance: alert, cooperative and appears stated age Head: Normocephalic, without obvious abnormality, atraumatic Neck: no adenopathy, no carotid bruit, no JVD, supple, symmetrical, trachea midline and thyroid not enlarged, symmetric, no  tenderness/mass/nodules Lymph nodes: Cervical, supraclavicular, and axillary nodes normal. Resp: clear to auscultation bilaterally and normal percussion bilaterally Back: symmetric, no curvature. ROM normal. No CVA tenderness. Cardio: regular rate and rhythm, S1, S2 normal, no murmur, click, rub or gallop and normal apical impulse GI: soft, non-tender; bowel sounds normal; no masses,  no organomegaly Extremities: extremities normal, atraumatic, no cyanosis or edema Neurologic: Alert and oriented X 3, normal strength and tone. Normal symmetric reflexes. Normal coordination and gait Bilateral breast examinations are performed. She is noted to have bilateral surgical scars. Right breast reveals some erythema due to her radiation therapy recently completed. He is no evidence of any masses nipple discharge or other skin changes. Left breast also reveals well-healed surgical scar no masses or nipple discharge. ECOG PERFORMANCE STATUS: 1 - Symptomatic but completely ambulatory  Blood pressure 121/70, pulse 81, temperature 98.3 F (36.8 C), temperature source Oral, resp. rate 20, height 5\' 8"  (1.727 m), weight 161 lb (73.029 kg).  LABORATORY DATA: Lab Results  Component Value Date   WBC 5.8 12/16/2012   HGB 12.4 12/16/2012   HCT 36.3 12/16/2012   MCV 88.7 12/16/2012   PLT 204 12/16/2012      Chemistry      Component Value Date/Time   NA 140 12/16/2012 1355   NA 141 05/21/2012 1028   K 3.8 12/16/2012 1355   K 4.9 05/21/2012 1028   CL 105 12/16/2012 1355   CL 106 05/21/2012 1028   CO2 26 12/16/2012 1355   CO2 26 05/21/2012 1028   BUN 19.0 12/16/2012 1355   BUN 14 05/21/2012 1028   CREATININE 0.9 12/16/2012 1355   CREATININE 0.80 05/21/2012 1028      Component Value Date/Time   CALCIUM 9.0 12/16/2012 1355   CALCIUM 9.5 05/21/2012 1028   ALKPHOS 94 12/16/2012 1355   ALKPHOS 81 05/21/2012 1028   AST 22 12/16/2012 1355   AST 16 05/21/2012 1028   ALT 23 12/16/2012 1355   ALT 13 05/21/2012 1028   BILITOT  0.48 12/16/2012 1355   BILITOT 0.6 05/21/2012 1028     ASSESSMENT: 62 year old female with  #1 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 Patient's tumor was ER positive PR negative HER-2/neu negative. She is now status post adjuvant chemotherapy consisting of 4 cycles of Taxotere and Cytoxan administered from 06/26/2011 to 08/28/2011 for a total of 4 cycles. She has also completed radiation therapy to the right breast on 10/22/2011.  #2 patient also has a history of left breast cancer previously.  #3 patient had genetic testing performed and she was negative for the BRCA1 and 2 gene mutation.  #4 Patient on Arimidex since 11/04/11   #4 Osteopenia/Osteoporosis  PLAN:   #1 patient on  adjuvant antiestrogen therapy consisting of Arimidex 1 mg daily.   2 overall patient is doing well she  will continue Arimidex as well as Fosamax.  #3 I will plan on seeing the patient back in 6 months time for followup.  All questions were answered. The patient knows to call the clinic with any problems, questions or concerns. We can certainly see the patient much sooner if necessary.  I spent 25 minutes counseling the patient face to face. The total time spent in the appointment was 30 minutes.    Drue Second, MD Medical/Oncology [facility] [phone] (beeper) [phone] (Office)  12/16/2012, 2:48 PM

## 2012-12-17 ENCOUNTER — Telehealth: Payer: Self-pay | Admitting: *Deleted

## 2012-12-17 NOTE — Telephone Encounter (Signed)
Message copied by Tahjay Binion, Gerald Leitz on Fri Dec 17, 2012 10:00 AM ------      Message from: Victorino December      Created: Fri Dec 17, 2012  8:59 AM       Call patient: vitamin D level great

## 2012-12-17 NOTE — Telephone Encounter (Signed)
Per MD notified pt vitamin D level great. Pt verbalized understanding. No further questions

## 2013-01-19 ENCOUNTER — Other Ambulatory Visit: Payer: Self-pay | Admitting: Oncology

## 2013-02-01 ENCOUNTER — Other Ambulatory Visit: Payer: Self-pay | Admitting: Oncology

## 2013-02-01 DIAGNOSIS — Z853 Personal history of malignant neoplasm of breast: Secondary | ICD-10-CM

## 2013-03-14 ENCOUNTER — Encounter (INDEPENDENT_AMBULATORY_CARE_PROVIDER_SITE_OTHER): Payer: Self-pay | Admitting: General Surgery

## 2013-03-14 ENCOUNTER — Ambulatory Visit (INDEPENDENT_AMBULATORY_CARE_PROVIDER_SITE_OTHER): Payer: BC Managed Care – PPO | Admitting: General Surgery

## 2013-03-14 ENCOUNTER — Ambulatory Visit
Admission: RE | Admit: 2013-03-14 | Discharge: 2013-03-14 | Disposition: A | Discharge status: Home / Self Care | Payer: BC Managed Care – PPO | Source: Ambulatory Visit | Attending: Oncology | Admitting: Oncology

## 2013-03-14 VITALS — BP 138/80 | HR 72 | Temp 98.3°F | Resp 20 | Ht 68.0 in | Wt 160.0 lb

## 2013-03-14 DIAGNOSIS — Z853 Personal history of malignant neoplasm of breast: Secondary | ICD-10-CM

## 2013-03-14 NOTE — Progress Notes (Signed)
Operation:  Right partial mastectomy, right axillary sentinel lymph node biopsy. Reexcision right partial mastectomy site, Port-A-Cath insertion.  Date:  May 07, 2011. Second operation June 09, 2011.  Pathology: T2 N1a  HPI:  She is here for long-term follow up of her breast cancer. She is taking Arimidex and tolerates it well as long as she takes it in the morning.  She denies any masses in her breasts or adenopathy.  She is having her mammogram today.  She has a sore spot on the posterior aspect of her left calf. This has been there a long time and has not changed in size.  Physical Exam: Right breast-upper outer and lateral scars are present; no masses are felt.  Left breast- Lateral scar is noted; no dominant masses present.  Lymph nodes-no axillary, supraclavicular, or cervical adenopathy. Left LE-5-7 mm subcutaneous nodule that is mobile on the posterior aspect of the left calf. Assessment:  T2 N1 right breast cancer-no clinical evidence of recurrence.  Also, has a chronic subcutaneous nodule on posterior aspect of left calf.  Plan: RTC 6 months.  If nodule enlarges or becomes more painful, will discuss removal.

## 2013-03-14 NOTE — Patient Instructions (Signed)
Please have them send [place] a copy of your mammogram report.

## 2013-06-15 ENCOUNTER — Other Ambulatory Visit (HOSPITAL_BASED_OUTPATIENT_CLINIC_OR_DEPARTMENT_OTHER): Payer: BC Managed Care – PPO | Admitting: Lab

## 2013-06-15 ENCOUNTER — Encounter: Payer: Self-pay | Admitting: Oncology

## 2013-06-15 ENCOUNTER — Telehealth: Payer: Self-pay | Admitting: *Deleted

## 2013-06-15 ENCOUNTER — Ambulatory Visit (HOSPITAL_BASED_OUTPATIENT_CLINIC_OR_DEPARTMENT_OTHER): Payer: BC Managed Care – PPO | Admitting: Oncology

## 2013-06-15 VITALS — BP 127/72 | HR 81 | Temp 98.1°F | Resp 20 | Ht 68.0 in | Wt 158.3 lb

## 2013-06-15 DIAGNOSIS — C50919 Malignant neoplasm of unspecified site of unspecified female breast: Secondary | ICD-10-CM

## 2013-06-15 LAB — COMPREHENSIVE METABOLIC PANEL (CC13)
Albumin: 3.9 g/dL (ref 3.5–5.0)
CO2: 25 mEq/L (ref 22–29)
Calcium: 9.7 mg/dL (ref 8.4–10.4)
Chloride: 103 mEq/L (ref 98–109)
Glucose: 73 mg/dl (ref 70–140)
Potassium: 4.3 mEq/L (ref 3.5–5.1)
Sodium: 140 mEq/L (ref 136–145)
Total Protein: 6.7 g/dL (ref 6.4–8.3)

## 2013-06-15 LAB — CBC WITH DIFFERENTIAL/PLATELET
Eosinophils Absolute: 0.1 10*3/uL (ref 0.0–0.5)
LYMPH%: 31.2 % (ref 14.0–49.7)
MONO#: 0.3 10*3/uL (ref 0.1–0.9)
NEUT#: 2.9 10*3/uL (ref 1.5–6.5)
Platelets: 202 10*3/uL (ref 145–400)
RBC: 4.51 10*6/uL (ref 3.70–5.45)
RDW: 12.8 % (ref 11.2–14.5)
WBC: 4.8 10*3/uL (ref 3.9–10.3)
lymph#: 1.5 10*3/uL (ref 0.9–3.3)

## 2013-06-15 NOTE — Patient Instructions (Addendum)
Continue arimidex and fosamax as you are  We will continue to see you back in 6 months

## 2013-06-15 NOTE — Telephone Encounter (Signed)
appts made and printed...td 

## 2013-06-15 NOTE — Progress Notes (Signed)
OFFICE PROGRESS NOTE  Dr. Avel Peace  Dr. Criselda Peaches, MD [address] 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  PRIOR THERAPY:  #1 patient has a history of left breast cancer for which she received lumpectomy with sentinel node biopsy followed by chemotherapy and radiation.  #2 she was then diagnosed with right breast cancer in May 2012. 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 She underwent a lumpectomy with sentinel node biopsy on 05/08/2011 one sentinel node was positive for metastatic disease. The tumor was estrogen receptor positive progesterone receptor negative HER-2/neu negative.  #3 she underwent adjuvant chemotherapy consisting of Taxotere and Cytoxan administered from 06/26/2011 to 08/28/2011 for a total of 4 cycles.  #4 patient completed radiation therapy to the right breast on 10/22/2011.  #5 patient will now begin adjuvant antiestrogen therapy consisting of Arimidex 1 mg daily. We did order a bone density scan today. She is also recommended to start vitamin D  #6 survivorship issues have been discussed with the patient  CURRENT THERAPY: Patient to begin Arimidex 1 mg daily since 11/04/11  INTERVAL HISTORY: Grace Carroll 62 y.o. female returns for followup visit. Overall patient is doing well. She was begun on Fosamax she is tolerating it well. She is back on Arimidex without any significant complaints. She is denying any fevers chills night sweats headaches no fatigue she does occasionally have hot flashes. She does have some arthritic type pains but they're much improved in comparison to her prior visit. And very much tolerable. She has no bleeding problems no vaginal discharge. No difficulty with ambulation. No reflux symptoms. Remainder of the 10  point review of systems is negative. MEDICAL HISTORY: Past Medical History  Diagnosis Date  . Ulcer of the stomach and intestine     per h&p Dr Dayton Scrape 04/02/11  . Cancer right breast  . Breast cancer     l breast lumpectomy- xrt 1992  . Fatigue 11/04/2011  . Heart murmur     noted 2 years ago  . Diabetes mellitus     diet controlled diabetic  . Headache(784.0)     occasional migraines- takes tylenol  . PONV (postoperative nausea and vomiting)     ALLERGIES:  is allergic to codeine; zofran; and gluten meal.  MEDICATIONS:  Current Outpatient Prescriptions  Medication Sig Dispense Refill  . alendronate (FOSAMAX) 70 MG tablet TAKE 1 TABLET BY MOUTH EVERY 7 DAYS. TAKE WITH A FULL GLASS OF WATER ON AN EMPTY STOMACH  12 tablet  3  . anastrozole (ARIMIDEX) 1 MG tablet TAKE 1 TABLET BY MOUTH DAILY.  30 tablet  8  . Calcium Carbonate-Vitamin D (RA CALCIUM PLUS VITAMIN D) 600-200 MG-UNIT TABS Take by mouth daily.      . cholecalciferol (VITAMIN D) 400 UNITS TABS Take 4,000 Units by mouth daily.       . ranitidine (ZANTAC) 300 MG tablet Take 300 mg by mouth as needed.        No current facility-administered medications for this visit.    SURGICAL HISTORY:  Past Surgical History  Procedure Laterality Date  . Appendectomy    . Tubal ligation    . Breast lumpectomy       left and right; right slnbx  . Carpal tunnel release      bilateral  .  Shoulder arthroscopy      bone spurs   . Port-a-cath removal  11/20/2011  . Mass excision  05/07/2012    Procedure: EXCISION MASS;  Surgeon: Adolph Pollack, MD;  Location:  SURGERY CENTER;  Service: General;  Laterality: Right;  remove right breast mass    REVIEW OF SYSTEMS:  Pertinent items are noted in HPI.   PHYSICAL EXAMINATION: General appearance: alert, cooperative and appears stated age Head: Normocephalic, without obvious abnormality, atraumatic Neck: no adenopathy, no carotid bruit, no JVD, supple, symmetrical, trachea  midline and thyroid not enlarged, symmetric, no tenderness/mass/nodules Lymph nodes: Cervical, supraclavicular, and axillary nodes normal. Resp: clear to auscultation bilaterally and normal percussion bilaterally Back: symmetric, no curvature. ROM normal. No CVA tenderness. Cardio: regular rate and rhythm, S1, S2 normal, no murmur, click, rub or gallop and normal apical impulse GI: soft, non-tender; bowel sounds normal; no masses,  no organomegaly Extremities: extremities normal, atraumatic, no cyanosis or edema Neurologic: Alert and oriented X 3, normal strength and tone. Normal symmetric reflexes. Normal coordination and gait Bilateral breast examinations are performed. She is noted to have bilateral surgical scars. Right breast reveals some erythema due to her radiation therapy recently completed. He is no evidence of any masses nipple discharge or other skin changes. Left breast also reveals well-healed surgical scar no masses or nipple discharge. ECOG PERFORMANCE STATUS: 1 - Symptomatic but completely ambulatory  Blood pressure 127/72, pulse 81, temperature 98.1 F (36.7 C), temperature source Oral, resp. rate 20, height 5\' 8"  (1.727 m), weight 158 lb 4.8 oz (71.804 kg).  LABORATORY DATA: Lab Results  Component Value Date   WBC 4.8 06/15/2013   HGB 13.5 06/15/2013   HCT 40.4 06/15/2013   MCV 89.6 06/15/2013   PLT 202 06/15/2013      Chemistry      Component Value Date/Time   NA 140 06/15/2013 0917   NA 141 05/21/2012 1028   K 4.3 06/15/2013 0917   K 4.9 05/21/2012 1028   CL 105 12/16/2012 1355   CL 106 05/21/2012 1028   CO2 25 06/15/2013 0917   CO2 26 05/21/2012 1028   BUN 15.3 06/15/2013 0917   BUN 14 05/21/2012 1028   CREATININE 1.0 06/15/2013 0917   CREATININE 0.80 05/21/2012 1028      Component Value Date/Time   CALCIUM 9.7 06/15/2013 0917   CALCIUM 9.5 05/21/2012 1028   ALKPHOS 92 06/15/2013 0917   ALKPHOS 81 05/21/2012 1028   AST 21 06/15/2013 0917   AST 16 05/21/2012 1028   ALT 23  06/15/2013 0917   ALT 13 05/21/2012 1028   BILITOT 0.47 06/15/2013 0917   BILITOT 0.6 05/21/2012 1028     ASSESSMENT: 62 year old female with  #1 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 Patient's tumor was ER positive PR negative HER-2/neu negative. She is now status post adjuvant chemotherapy consisting of 4 cycles of Taxotere and Cytoxan administered from 06/26/2011 to 08/28/2011 for a total of 4 cycles. She has also completed radiation therapy to the right breast on 10/22/2011.  #2 patient also has a history of left breast cancer previously.  #3 patient had genetic testing performed and she was negative for the BRCA1 and 2 gene mutation.  #4 Patient on Arimidex since 11/04/11   #4 Osteopenia/Osteoporosis  PLAN:   #1 patient on  adjuvant antiestrogen therapy consisting of Arimidex 1 mg daily.   2  overall patient is doing well she will continue Arimidex as well as Fosamax.  #3 I will plan on seeing the patient back in 6 months time for followup.  All questions were answered. The patient knows to call the clinic with any problems, questions or concerns. We can certainly see the patient much sooner if necessary.  I spent 25 minutes counseling the patient face to face. The total time spent in the appointment was 30 minutes.    Drue Second, MD Medical/Oncology [facility] [phone] (beeper) [phone] (Office)  06/15/2013, 10:58 AM

## 2013-06-28 DIAGNOSIS — E538 Deficiency of other specified B group vitamins: Secondary | ICD-10-CM | POA: Insufficient documentation

## 2013-06-28 DIAGNOSIS — E559 Vitamin D deficiency, unspecified: Secondary | ICD-10-CM | POA: Insufficient documentation

## 2013-06-28 DIAGNOSIS — K219 Gastro-esophageal reflux disease without esophagitis: Secondary | ICD-10-CM | POA: Insufficient documentation

## 2013-09-20 ENCOUNTER — Ambulatory Visit (INDEPENDENT_AMBULATORY_CARE_PROVIDER_SITE_OTHER): Payer: BC Managed Care – PPO | Admitting: General Surgery

## 2013-09-20 ENCOUNTER — Encounter (INDEPENDENT_AMBULATORY_CARE_PROVIDER_SITE_OTHER): Payer: Self-pay | Admitting: General Surgery

## 2013-09-20 VITALS — BP 124/72 | HR 64 | Temp 98.0°F | Resp 18 | Ht 68.0 in | Wt 158.0 lb

## 2013-09-20 DIAGNOSIS — Z853 Personal history of malignant neoplasm of breast: Secondary | ICD-10-CM

## 2013-09-20 NOTE — Progress Notes (Signed)
Operation:  Right partial mastectomy, right axillary sentinel lymph node biopsy. Reexcision right partial mastectomy site, Port-A-Cath insertion.  Date:  May 07, 2011. Second operation June 09, 2011.  Pathology: T2 N1a  HPI:  She is here for long-term follow up of her breast cancer. She is still taking Arimidex.  She denies any masses in her breasts or adenopathy.  Physical Exam: Right breast-upper outer and lateral scars are present; no masses are felt.  Left breast- Lateral scar is noted; no dominant masses present.  Lymph nodes-no axillary, supraclavicular, or cervical adenopathy.  Assessment:  T2 N1 right breast cancer-no clinical evidence of recurrence.    Plan: RTC 9 months. This will allow her to be seen by myself or Dr. Welton Flakes twice a year.

## 2013-09-20 NOTE — Patient Instructions (Signed)
Call if you feel anything abnormal on the breast exam.

## 2013-10-12 ENCOUNTER — Other Ambulatory Visit: Payer: Self-pay | Admitting: *Deleted

## 2013-10-12 ENCOUNTER — Telehealth: Payer: Self-pay | Admitting: *Deleted

## 2013-10-12 DIAGNOSIS — C50919 Malignant neoplasm of unspecified site of unspecified female breast: Secondary | ICD-10-CM

## 2013-10-12 NOTE — Telephone Encounter (Signed)
Next available appointment with Dr. Welton Flakes or myself.  Thanks, L

## 2013-10-12 NOTE — Telephone Encounter (Signed)
Follow up on Friday with me as discussed

## 2013-10-12 NOTE — Telephone Encounter (Signed)
Bone scan results rcvd from Amy at [facility] . MD reviewed  Results. Requested to see pt on Friday 10/14/13 at 12:30pm with lab prior. Notified pt of new appt date/time. POF sent

## 2013-10-12 NOTE — Telephone Encounter (Signed)
Received message from [place] in scheduling to call Amy at [facility] regarding patient's bone scan. Called to Amy at [phone] who states " the patient's bone scan was abnormal, requesting patient follow-up with MD.  Requested Amy fax over pt's last progress and bone scan results. Fax received and placed in MD's box for review. Last seen 06/15/2013. Future appt. On 12/16/2013. Message forwarded to provider

## 2013-10-14 ENCOUNTER — Ambulatory Visit (HOSPITAL_BASED_OUTPATIENT_CLINIC_OR_DEPARTMENT_OTHER): Payer: BC Managed Care – PPO | Admitting: Oncology

## 2013-10-14 ENCOUNTER — Other Ambulatory Visit: Payer: Self-pay | Admitting: *Deleted

## 2013-10-14 ENCOUNTER — Other Ambulatory Visit (HOSPITAL_BASED_OUTPATIENT_CLINIC_OR_DEPARTMENT_OTHER): Payer: BC Managed Care – PPO

## 2013-10-14 ENCOUNTER — Ambulatory Visit: Payer: BC Managed Care – PPO

## 2013-10-14 VITALS — BP 147/75 | HR 75 | Temp 97.6°F | Resp 20 | Ht 68.0 in | Wt 158.3 lb

## 2013-10-14 DIAGNOSIS — M899 Disorder of bone, unspecified: Secondary | ICD-10-CM

## 2013-10-14 DIAGNOSIS — C50919 Malignant neoplasm of unspecified site of unspecified female breast: Secondary | ICD-10-CM

## 2013-10-14 DIAGNOSIS — C50912 Malignant neoplasm of unspecified site of left female breast: Secondary | ICD-10-CM

## 2013-10-14 DIAGNOSIS — M810 Age-related osteoporosis without current pathological fracture: Secondary | ICD-10-CM

## 2013-10-14 DIAGNOSIS — Z853 Personal history of malignant neoplasm of breast: Secondary | ICD-10-CM

## 2013-10-14 DIAGNOSIS — M549 Dorsalgia, unspecified: Secondary | ICD-10-CM

## 2013-10-14 LAB — COMPREHENSIVE METABOLIC PANEL (CC13)
AST: 24 U/L (ref 5–34)
Albumin: 4.2 g/dL (ref 3.5–5.0)
Alkaline Phosphatase: 91 U/L (ref 40–150)
CO2: 24 mEq/L (ref 22–29)
Calcium: 9.4 mg/dL (ref 8.4–10.4)
Creatinine: 0.8 mg/dL (ref 0.6–1.1)
Total Protein: 6.7 g/dL (ref 6.4–8.3)

## 2013-10-14 LAB — CBC WITH DIFFERENTIAL/PLATELET
BASO%: 0.9 % (ref 0.0–2.0)
EOS%: 1.3 % (ref 0.0–7.0)
HCT: 38.8 % (ref 34.8–46.6)
LYMPH%: 25.2 % (ref 14.0–49.7)
MCH: 29.3 pg (ref 25.1–34.0)
MCHC: 32.2 g/dL (ref 31.5–36.0)
MCV: 90.8 fL (ref 79.5–101.0)
MONO#: 0.3 10*3/uL (ref 0.1–0.9)
NEUT%: 66.9 % (ref 38.4–76.8)
Platelets: 219 10*3/uL (ref 145–400)

## 2013-10-14 NOTE — Telephone Encounter (Signed)
appts made and printed. Pt is aware that cs will call w/ appt for MRI'S and DG bone density...td

## 2013-10-30 NOTE — Progress Notes (Signed)
OFFICE PROGRESS NOTE  Dr. Avel Peace  Dr. Criselda Peaches, MD [address] 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  PRIOR THERAPY:  #1 patient has a history of left breast cancer for which she received lumpectomy with sentinel node biopsy followed by chemotherapy and radiation.  #2 she was then diagnosed with right breast cancer in May 2012. 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 She underwent a lumpectomy with sentinel node biopsy on 05/08/2011 one sentinel node was positive for metastatic disease. The tumor was estrogen receptor positive progesterone receptor negative HER-2/neu negative.  #3 she underwent adjuvant chemotherapy consisting of Taxotere and Cytoxan administered from 06/26/2011 to 08/28/2011 for a total of 4 cycles.  #4 patient completed radiation therapy to the right breast on 10/22/2011.  #5 patient will now begin adjuvant antiestrogen therapy consisting of Arimidex 1 mg daily. We did order a bone density scan today. She is also recommended to start vitamin D  #6 survivorship issues have been discussed with the patient  CURRENT THERAPY:  Arimidex 1 mg daily since 11/04/11  INTERVAL HISTORY: Grace Carroll 62 y.o. female returns for visit at the request of her primary care physician. Patient recently developed some back pain. She had a imaging study performed that revealed some suspicious lesions. Because of this she was referred back to medical oncology earlier. Today patient states that she feels better she has no issues we discussed her bone scan results. She is denying any nausea vomiting she has no peripheral paresthesias she has no changes in her bowel or bladder habits. Her pain is not an persistent it does go away as she moves around. She  otherwise is without any other complaints. MEDICAL HISTORY: Past Medical History  Diagnosis Date  . Ulcer of the stomach and intestine     per h&p Dr Dayton Scrape 04/02/11  . Cancer right breast  . Breast cancer     l breast lumpectomy- xrt 1992  . Fatigue 11/04/2011  . Heart murmur     noted 2 years ago  . Diabetes mellitus     diet controlled diabetic  . Headache(784.0)     occasional migraines- takes tylenol  . PONV (postoperative nausea and vomiting)     ALLERGIES:  is allergic to codeine; zofran; and gluten meal.  MEDICATIONS:  Current Outpatient Prescriptions  Medication Sig Dispense Refill  . alendronate (FOSAMAX) 70 MG tablet TAKE 1 TABLET BY MOUTH EVERY 7 DAYS. TAKE WITH A FULL GLASS OF WATER ON AN EMPTY STOMACH  12 tablet  3  . anastrozole (ARIMIDEX) 1 MG tablet TAKE 1 TABLET BY MOUTH DAILY.  30 tablet  8  . Calcium Carbonate-Vitamin D (RA CALCIUM PLUS VITAMIN D) 600-200 MG-UNIT TABS Take by mouth daily.      . cholecalciferol (VITAMIN D) 400 UNITS TABS Take 4,000 Units by mouth daily.       . ranitidine (ZANTAC) 300 MG tablet Take 300 mg by mouth as needed.        No current facility-administered medications for this visit.    SURGICAL HISTORY:  Past Surgical History  Procedure Laterality Date  . Appendectomy    . Tubal ligation    . Breast lumpectomy       left and right; right slnbx  . Carpal tunnel release  bilateral  . Shoulder arthroscopy      bone spurs   . Port-a-cath removal  11/20/2011  . Mass excision  05/07/2012    Procedure: EXCISION MASS;  Surgeon: Adolph Pollack, MD;  Location: [facility];  Service: General;  Laterality: Right;  remove right breast mass    REVIEW OF SYSTEMS:  Pertinent items are noted in HPI.   PHYSICAL EXAMINATION: General appearance: alert, cooperative and appears stated age Head: Normocephalic, without obvious abnormality, atraumatic Neck: no adenopathy, no carotid bruit, no JVD, supple, symmetrical, trachea  midline and thyroid not enlarged, symmetric, no tenderness/mass/nodules Lymph nodes: Cervical, supraclavicular, and axillary nodes normal. Resp: clear to auscultation bilaterally and normal percussion bilaterally Back: symmetric, no curvature. ROM normal. No CVA tenderness. Cardio: regular rate and rhythm, S1, S2 normal, no murmur, click, rub or gallop and normal apical impulse GI: soft, non-tender; bowel sounds normal; no masses,  no organomegaly Extremities: extremities normal, atraumatic, no cyanosis or edema Neurologic: Alert and oriented X 3, normal strength and tone. Normal symmetric reflexes. Normal coordination and gait Bilateral breast examinations are performed. She is noted to have bilateral surgical scars. Right breast reveals some erythema due to her radiation therapy recently completed. He is no evidence of any masses nipple discharge or other skin changes. Left breast also reveals well-healed surgical scar no masses or nipple discharge. ECOG PERFORMANCE STATUS: 1 - Symptomatic but completely ambulatory  Blood pressure 147/75, pulse 75, temperature 97.6 F (36.4 C), temperature source Oral, resp. rate 20, height 5\' 8"  (1.727 m), weight 158 lb 4.8 oz (71.804 kg).  LABORATORY DATA: Lab Results  Component Value Date   WBC 5.3 10/14/2013   HGB 12.5 10/14/2013   HCT 38.8 10/14/2013   MCV 90.8 10/14/2013   PLT 219 10/14/2013      Chemistry      Component Value Date/Time   NA 140 10/14/2013 1208   NA 141 05/21/2012 1028   K 4.3 10/14/2013 1208   K 4.9 05/21/2012 1028   CL 105 12/16/2012 1355   CL 106 05/21/2012 1028   CO2 24 10/14/2013 1208   CO2 26 05/21/2012 1028   BUN 18.2 10/14/2013 1208   BUN 14 05/21/2012 1028   CREATININE 0.8 10/14/2013 1208   CREATININE 0.80 05/21/2012 1028      Component Value Date/Time   CALCIUM 9.4 10/14/2013 1208   CALCIUM 9.5 05/21/2012 1028   ALKPHOS 91 10/14/2013 1208   ALKPHOS 81 05/21/2012 1028   AST 24 10/14/2013 1208   AST 16 05/21/2012  1028   ALT 20 10/14/2013 1208   ALT 13 05/21/2012 1028   BILITOT 0.63 10/14/2013 1208   BILITOT 0.6 05/21/2012 1028     ASSESSMENT: 62 year old female with  #1 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 Patient's tumor was ER positive PR negative HER-2/neu negative. She is now status post adjuvant chemotherapy consisting of 4 cycles of Taxotere and Cytoxan administered from 06/26/2011 to 08/28/2011 for a total of 4 cycles. She has also completed radiation therapy to the right breast on 10/22/2011.  #2 patient also has a history of left breast cancer previously.  #3 patient had genetic testing performed and she was negative for the BRCA1 and 2 gene mutation.  #4 Patient on Arimidex since 11/04/11   #4 Osteopenia/Osteoporosis  #5 lower back pain unclear etiology. Patient had an abnormal bone scan. We will investigate this  further  PLAN:  #1 proceed with MRI of the spine.  #2 patient will be seen back in 6 months time or sooner if need arises  All questions were answered. The patient knows to call the clinic with any problems, questions or concerns. We can certainly see the patient much sooner if necessary.  I spent 15 minutes counseling the patient face to face. The total time spent in the appointment was 30 minutes.    Drue Second, MD Medical/Oncology [facility] [phone] (beeper) [phone] (Office)

## 2013-10-31 ENCOUNTER — Ambulatory Visit (HOSPITAL_COMMUNITY)
Admission: RE | Admit: 2013-10-31 | Discharge: 2013-10-31 | Disposition: A | Discharge status: Home / Self Care | Payer: BC Managed Care – PPO | Source: Ambulatory Visit | Attending: Oncology | Admitting: Oncology

## 2013-10-31 DIAGNOSIS — M549 Dorsalgia, unspecified: Secondary | ICD-10-CM

## 2013-10-31 DIAGNOSIS — M47817 Spondylosis without myelopathy or radiculopathy, lumbosacral region: Secondary | ICD-10-CM | POA: Insufficient documentation

## 2013-10-31 DIAGNOSIS — C50912 Malignant neoplasm of unspecified site of left female breast: Secondary | ICD-10-CM

## 2013-10-31 DIAGNOSIS — M5126 Other intervertebral disc displacement, lumbar region: Secondary | ICD-10-CM | POA: Insufficient documentation

## 2013-10-31 DIAGNOSIS — Z853 Personal history of malignant neoplasm of breast: Secondary | ICD-10-CM | POA: Insufficient documentation

## 2013-10-31 DIAGNOSIS — M5137 Other intervertebral disc degeneration, lumbosacral region: Secondary | ICD-10-CM | POA: Insufficient documentation

## 2013-10-31 DIAGNOSIS — M51379 Other intervertebral disc degeneration, lumbosacral region without mention of lumbar back pain or lower extremity pain: Secondary | ICD-10-CM | POA: Insufficient documentation

## 2013-10-31 DIAGNOSIS — D739 Disease of spleen, unspecified: Secondary | ICD-10-CM | POA: Insufficient documentation

## 2013-10-31 MED ORDER — GADOBENATE DIMEGLUMINE 529 MG/ML IV SOLN
15.0000 mL | Freq: Once | INTRAVENOUS | Status: AC | PRN
  Administered 2013-10-31: 15 mL via INTRAVENOUS

## 2013-11-01 NOTE — Progress Notes (Signed)
Quick Note:  Please call patient: MRI looks good no cancer in the spine ______

## 2013-11-09 ENCOUNTER — Telehealth: Payer: Self-pay | Admitting: *Deleted

## 2013-11-09 NOTE — Telephone Encounter (Signed)
Message copied by Cooper Render on Wed Nov 09, 2013 10:57 AM ------      Message from: Victorino December      Created: Tue Nov 01, 2013 12:59 PM       Please call patient: MRI looks good no cancer in the spine ------

## 2013-11-09 NOTE — Telephone Encounter (Signed)
Notified pt MRI looks good. No cancer in the spine

## 2013-12-16 ENCOUNTER — Encounter: Payer: Self-pay | Admitting: Oncology

## 2013-12-16 ENCOUNTER — Ambulatory Visit (HOSPITAL_BASED_OUTPATIENT_CLINIC_OR_DEPARTMENT_OTHER): Payer: BC Managed Care – PPO | Admitting: Oncology

## 2013-12-16 ENCOUNTER — Encounter (INDEPENDENT_AMBULATORY_CARE_PROVIDER_SITE_OTHER): Payer: Self-pay

## 2013-12-16 ENCOUNTER — Other Ambulatory Visit (HOSPITAL_BASED_OUTPATIENT_CLINIC_OR_DEPARTMENT_OTHER): Payer: BC Managed Care – PPO

## 2013-12-16 ENCOUNTER — Telehealth: Payer: Self-pay | Admitting: Oncology

## 2013-12-16 VITALS — BP 118/71 | HR 80 | Temp 98.2°F | Resp 20 | Ht 68.0 in | Wt 157.7 lb

## 2013-12-16 DIAGNOSIS — M810 Age-related osteoporosis without current pathological fracture: Secondary | ICD-10-CM

## 2013-12-16 DIAGNOSIS — C50919 Malignant neoplasm of unspecified site of unspecified female breast: Secondary | ICD-10-CM

## 2013-12-16 DIAGNOSIS — Z853 Personal history of malignant neoplasm of breast: Secondary | ICD-10-CM

## 2013-12-16 DIAGNOSIS — M545 Low back pain, unspecified: Secondary | ICD-10-CM

## 2013-12-16 LAB — CBC WITH DIFFERENTIAL/PLATELET
BASO%: 0.7 % (ref 0.0–2.0)
Basophils Absolute: 0.1 10*3/uL (ref 0.0–0.1)
EOS ABS: 0.1 10*3/uL (ref 0.0–0.5)
EOS%: 0.8 % (ref 0.0–7.0)
HCT: 37 % (ref 34.8–46.6)
HGB: 12.4 g/dL (ref 11.6–15.9)
LYMPH%: 21.1 % (ref 14.0–49.7)
MCH: 30.3 pg (ref 25.1–34.0)
MCHC: 33.5 g/dL (ref 31.5–36.0)
MCV: 90.5 fL (ref 79.5–101.0)
MONO#: 0.5 10*3/uL (ref 0.1–0.9)
MONO%: 7.3 % (ref 0.0–14.0)
NEUT#: 5.2 10*3/uL (ref 1.5–6.5)
NEUT%: 70.1 % (ref 38.4–76.8)
Platelets: 247 10*3/uL (ref 145–400)
RBC: 4.09 10*6/uL (ref 3.70–5.45)
RDW: 13 % (ref 11.2–14.5)
WBC: 7.4 10*3/uL (ref 3.9–10.3)
lymph#: 1.6 10*3/uL (ref 0.9–3.3)

## 2013-12-16 LAB — COMPREHENSIVE METABOLIC PANEL (CC13)
ALBUMIN: 4.1 g/dL (ref 3.5–5.0)
ALK PHOS: 161 U/L — AB (ref 40–150)
ALT: 40 U/L (ref 0–55)
AST: 21 U/L (ref 5–34)
Anion Gap: 11 mEq/L (ref 3–11)
BILIRUBIN TOTAL: 0.55 mg/dL (ref 0.20–1.20)
BUN: 18.8 mg/dL (ref 7.0–26.0)
CO2: 26 mEq/L (ref 22–29)
Calcium: 9.6 mg/dL (ref 8.4–10.4)
Chloride: 105 mEq/L (ref 98–109)
Creatinine: 0.9 mg/dL (ref 0.6–1.1)
GLUCOSE: 106 mg/dL (ref 70–140)
POTASSIUM: 4.5 meq/L (ref 3.5–5.1)
SODIUM: 142 meq/L (ref 136–145)
TOTAL PROTEIN: 6.8 g/dL (ref 6.4–8.3)

## 2013-12-16 NOTE — Telephone Encounter (Signed)
, °

## 2013-12-16 NOTE — Patient Instructions (Signed)
Breast Cancer Survivor Follow-Up Breast cancer begins when cells in the breast divide too rapidly. The extra cells form a lump (tumor). When the cancer is treated, the goal is to get rid of all cancer cells. However, sometimes a few cells survive. These cancer cells can then grow. They become recurrent cancer. This means the cancer comes back after treatment.  Most cases of recurrent breast cancer develop 3 to 5 years after treatment. However, sometimes it comes back just a few months after treatment. Other times, it does not come back until years later. If the cancer comes back in the same area as the first breast cancer, it is called a local recurrence. If the cancer comes back somewhere else in the body, it is called regional recurrence if the site is fairly near the breast or distant recurrence if it is far from the breast. Your caregiver may also use the term metastasize to indicate a cancer that has gone to another part of your body. Treatment is still possible after either kind of recurrence. The cancer can still be controlled.  CAUSES OF RECURRENT CANCER No one knows exactly why breast cancer starts in the first place. Why the cancer comes back after treatment is also not clear. It is known that certain conditions, called risk factors, can make this more likely. They include:  Developing breast cancer for the first time before age 60.  Having breast cancer that involves the lymph nodes. These are small, round pieces of tissue found all over the body. Their job is to help fight infections.  Having a large tumor. Cancer is more apt to come back if the first tumor was bigger than 2 inches (5 cm).  Having certain types of breast cancer, such as:  Inflammatory breast cancer. This rare type grows rapidly and causes the breast to become red and swollen.  A high-grade tumor. The grade of a tumor indicates how fast it will grow and spread. High-grade tumors grow more quickly than other types.  HER2  cancer. This refers to the tumor's genetic makeup. Tumors that have this type of gene are more likely to come back after treatment.  Having close tumor margins. This refers to the space between the tumor and normal, noncancerous cells. If the space is small, the tumor has a greater chance of coming back.  Having treatment involving a surgery to remove the tumor but not the entire breast (lumpectomy) and no radiation therapy. CARE AFTER BREAST CANCER Home Monitoring Women who have had breast cancer should continue to examine their breasts every month. The goal is to catch the cancer quickly if it comes back. Many women find it helpful to do so on the same day each month and to mark the calendar as a reminder. Let your caregiver know immediately if you have any signs of recurrent breast cancer. Symptoms will vary, depending on where the cancer recurs. The original type of treatment can also make a difference. Symptoms of local recurrence after a lumpectomy or a recurrence in the opposite breast may include:  A new lump or thickening in the breast.  A change in the way the skin looks on the breast (such as a rash, dimpling, or wrinkling).  Redness or swelling of the breast.  Changes in the nipple (such as being red, puckered, swollen, or leaking fluid). Symptoms of a recurrence after a breast removal surgery (mastectomy) may include:  A lump or thickening under the skin.  A thickening around the mastectomy scar. Symptoms   of regional recurrence in the lymph nodes near the breast may include:  A lump under the arm or above the collarbone.  Swelling of the arm.  Pain in the arm, shoulder, or chest.  Numbness in the hand or arm. Symptoms of distant recurrence may include:  A cough that does not go away.  Trouble breathing or shortness of breath.  Pain in the bones or the chest. This is pain that lasts or does not respond to rest and medicine.  Headaches.  Sudden vision  problems.  Dizziness.  Nausea or vomiting.  Losing weight without trying to.  Persistent abdominal pain.  Changes in bowel movements or blood in the stool.  Yellowing of the skin or eyes (jaundice).  Blood in the urine or bloody vaginal discharge. Clinical Monitoring  It is helpful to keep a schedule of appointments for needed tests and exams. This includes physical exams, breast exams, exams of the lymph nodes, and general exams.  For the first 3 years after being treated for breast cancer, see your caregiver every 3 to 6 months.  For years 4 and 5 after breast cancer, see your caregiver every 6 to 12 months.  After 5 years, see your caregiver at least once a year.  Regular breast X-rays (mammograms) should continue even if you had a mastectomy.  A mammogram should be done 1 year after the mammogram that first detected breast cancer.  A mammogram should be done every 6 to 12 months after that. Follow your caregiver's advice.  A pelvic exam done by your caregiver checks whether female organs are the normal size and shape. The exam is usually done every year. Ask your caregiver if that schedule is right for you.  Women taking tamoxifen should report any vaginal bleeding immediately to their caregiver. Tamoxifen is often given to women with a certain type of breast cancer. It has been shown to help prevent recurrence.  You will need to decide who your primary caregiver will be.  Most people continue to see their cancer specialist (oncologist) every 3 to 6 months for the first year after cancer treatment.  At some point, you may want to go back to seeing your family caregiver. You would no longer see your oncologist for regular checkups. Many women do this about 1 year after their first diagnosis of breast cancer.  You will still need to be seen every so often by your oncologist. Ask how often that should be. Coordinate this with your family or primary caregiver.  Think about  having genetic counseling. This would provide information on traits that can be passed or inherited from one generation to the next. In some cases, breast cancer runs in families. Tell your caregiver if you:  Are of Ashkenazi Jewish heritage.  Have any family member who has had ovarian cancer.  Have a mother, sister, or daughter who had breast cancer before age 7.  Have 2 or more close relatives who have had breast cancer. This means a mother, sister, daughter, aunt, or grandmother.  Had breast cancer in both breasts.  Have a female relative who has had breast cancer.  Some tests are not recommended for routine screening. Someone recovering from breast cancer does not need to have these tests if there are no problems. The tests have risks, such as radiation exposure, and can be costly. The risks of these tests are thought to be greater than the benefits:  Blood tests.  Chest X-rays.  Bone scans.  Liver ultrasound.  Computed tomography (CT scan).  Positron emission tomography (PET scan).  Magnetic resonance imaging (MRI scan). DIAGNOSIS OF RECURRENT CANCER Recurrent breast cancer may be suspected for various reasons. A mammogram may not look normal. You might feel a lump or have other symptoms. Your caregiver may find something unusual during an exam. To be sure, your caregiver will probably order some tests. The tests are needed because there are symptoms or hints of a problem. They could include:  Blood tests, including a test to check how well the liver is working. The liver is a common site for a distant cancer recurrence.  Imaging tests that create pictures of the inside of the body. These tests include:  Chest X-rays to show if the cancer has come back in the lungs.  CT scans to create detailed pictures of various areas of the body and help find a distant recurrence.  MRI scans to find anything unusual in the breast, chest, or lymph nodes.  Breast ultrasound tests to  examine the breasts.  Bone scans to create a picture of your whole skeleton and find cancer in bony areas.  PET scans to create an image of the whole body. PET scans can be used together with CT scans to show more detail.  Biopsy. A small sample of tissue is taken and checked under a microscope. If cancer cells are found, they may be tested to see if they contain the HER2 gene or the hormones estrogen and progesterone. This will help your caregiver decide how to treat the recurrent cancer. TREATMENT  How recurrent breast cancer is treated depends on where the new cancer is found. The type of treatment that was used for the first breast cancer makes a difference, too. A combination of treatments may be used. Options include:  Surgery.  If the cancer comes back in the breast that was not treated before, you may need a lumpectomy or mastectomy.  If the cancer comes back in the breast that was treated before, you may need a mastectomy.  The lymph nodes under the arm may need to be removed.  Radiation therapy.  For a local recurrence, radiation may be used if it was not used during the first treatment.  For a distance recurrence, radiation is sometimes used.  Chemotherapy.  This may be used before surgery to treat recurrent breast cancer.  This may be used to treat recurrent cancer that cannot be treated with surgery.  This may be used to treat a distant recurrence.  Hormone therapy.  Women with the HER2 gene may be given hormone therapy to attack this gene. Document Released: 07/16/2011 Document Revised: 02/09/2012 Document Reviewed: 07/16/2011 [employer] Patient Information 2014 [employer].  

## 2013-12-26 NOTE — Progress Notes (Signed)
OFFICE PROGRESS NOTE  Dr. Jackolyn Confer  Dr. Eulis Manly, MD [address] 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  PRIOR THERAPY:  #1 patient has a history of left breast cancer for which she received lumpectomy with sentinel node biopsy followed by chemotherapy and radiation.  #2 she was then diagnosed with right breast cancer in May 2012. 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 She underwent a lumpectomy with sentinel node biopsy on 05/08/2011 one sentinel node was positive for metastatic disease. The tumor was estrogen receptor positive progesterone receptor negative HER-2/neu negative.  #3 she underwent adjuvant chemotherapy consisting of Taxotere and Cytoxan administered from 06/26/2011 to 08/28/2011 for a total of 4 cycles.  #4 patient completed radiation therapy to the right breast on 10/22/2011.  #5  adjuvant antiestrogen therapy consisting of Arimidex 1 mg daily since 11/04/11.   #6 survivorship issues have been discussed with the patient  CURRENT THERAPY:  Arimidex 1 mg daily since 11/04/11  INTERVAL HISTORY: Grace Carroll 63 y.o. female returns for visit. Patient's last visit was in November 2014. She was seen at the request of her primary care physician with back pain. Workup included MRI of the back. There is no evidence of recurrent disease. Patient continues to have back pain. She continues the remote ask. She's tolerating the Arimidex well. I do think she may benefit from physical therapy. She otherwise denies any headaches double vision or vision nausea vomiting abdominal pain no peripheral paresthesias. Remainder of the 10 point review of systems is negative.  MEDICAL HISTORY: Past Medical History  Diagnosis Date  . Ulcer of the stomach and  intestine     per h&p Dr Valere Dross 04/02/11  . Cancer right breast  . Breast cancer     l breast lumpectomy- xrt 1992  . Fatigue 11/04/2011  . Heart murmur     noted 2 years ago  . Diabetes mellitus     diet controlled diabetic  . Headache(784.0)     occasional migraines- takes tylenol  . PONV (postoperative nausea and vomiting)     ALLERGIES:  is allergic to codeine; zofran; and gluten meal.  MEDICATIONS:  Current Outpatient Prescriptions  Medication Sig Dispense Refill  . alendronate (FOSAMAX) 70 MG tablet TAKE 1 TABLET BY MOUTH EVERY 7 DAYS. TAKE WITH A FULL GLASS OF WATER ON AN EMPTY STOMACH  12 tablet  3  . anastrozole (ARIMIDEX) 1 MG tablet TAKE 1 TABLET BY MOUTH DAILY.  30 tablet  8  . budesonide-formoterol (SYMBICORT) 160-4.5 MCG/ACT inhaler Inhale 2 puffs into the lungs 2 (two) times daily.      . Calcium Carbonate-Vitamin D (RA CALCIUM PLUS VITAMIN D) 600-200 MG-UNIT TABS Take by mouth daily.      . cholecalciferol (VITAMIN D) 400 UNITS TABS Take 4,000 Units by mouth daily.       . ranitidine (ZANTAC) 300 MG tablet Take 300 mg by mouth as needed.        No current facility-administered medications for this visit.    SURGICAL HISTORY:  Past Surgical History  Procedure Laterality Date  . Appendectomy    . Tubal ligation    . Breast lumpectomy       left and right; right slnbx  . Carpal tunnel release      bilateral  .  Shoulder arthroscopy      bone spurs   . Port-a-cath removal  11/20/2011  . Mass excision  05/07/2012    Procedure: EXCISION MASS;  Surgeon: Odis Hollingshead, MD;  Location: [facility];  Service: General;  Laterality: Right;  remove right breast mass    REVIEW OF SYSTEMS:  Pertinent items are noted in HPI.   PHYSICAL EXAMINATION: General appearance: alert, cooperative and appears stated age Head: Normocephalic, without obvious abnormality, atraumatic Neck: no adenopathy, no carotid bruit, no JVD, supple, symmetrical, trachea midline and  thyroid not enlarged, symmetric, no tenderness/mass/nodules Lymph nodes: Cervical, supraclavicular, and axillary nodes normal. Resp: clear to auscultation bilaterally and normal percussion bilaterally Back: symmetric, no curvature. ROM normal. No CVA tenderness. Cardio: regular rate and rhythm, S1, S2 normal, no murmur, click, rub or gallop and normal apical impulse GI: soft, non-tender; bowel sounds normal; no masses,  no organomegaly Extremities: extremities normal, atraumatic, no cyanosis or edema Neurologic: Alert and oriented X 3, normal strength and tone. Normal symmetric reflexes. Normal coordination and gait Bilateral breast examinations are performed. She is noted to have bilateral surgical scars. Right breast reveals some erythema due to her radiation therapy recently completed. He is no evidence of any masses nipple discharge or other skin changes. Left breast also reveals well-healed surgical scar no masses or nipple discharge. ECOG PERFORMANCE STATUS: 1 - Symptomatic but completely ambulatory  Blood pressure 118/71, pulse 80, temperature 98.2 F (36.8 C), temperature source Oral, resp. rate 20, height '5\' 8"'  (1.727 m), weight 157 lb 11.2 oz (71.532 kg).  LABORATORY DATA: Lab Results  Component Value Date   WBC 7.4 12/16/2013   HGB 12.4 12/16/2013   HCT 37.0 12/16/2013   MCV 90.5 12/16/2013   PLT 247 12/16/2013      Chemistry      Component Value Date/Time   NA 142 12/16/2013 1420   NA 141 05/21/2012 1028   K 4.5 12/16/2013 1420   K 4.9 05/21/2012 1028   CL 105 12/16/2012 1355   CL 106 05/21/2012 1028   CO2 26 12/16/2013 1420   CO2 26 05/21/2012 1028   BUN 18.8 12/16/2013 1420   BUN 14 05/21/2012 1028   CREATININE 0.9 12/16/2013 1420   CREATININE 0.80 05/21/2012 1028      Component Value Date/Time   CALCIUM 9.6 12/16/2013 1420   CALCIUM 9.5 05/21/2012 1028   ALKPHOS 161* 12/16/2013 1420   ALKPHOS 81 05/21/2012 1028   AST 21 12/16/2013 1420   AST 16 05/21/2012 1028   ALT 40  12/16/2013 1420   ALT 13 05/21/2012 1028   BILITOT 0.55 12/16/2013 1420   BILITOT 0.6 05/21/2012 1028     ASSESSMENT: 63 year old female with  #1 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 Patient's tumor was ER positive PR negative HER-2/neu negative. She is now status post adjuvant chemotherapy consisting of 4 cycles of Taxotere and Cytoxan administered from 06/26/2011 to 08/28/2011 for a total of 4 cycles. She has also completed radiation therapy to the right breast on 10/22/2011.  #2 patient also has a history of left breast cancer previously.  #3 patient had genetic testing performed and she was negative for the BRCA1 and 2 gene mutation.  #4 Patient on Arimidex since 11/04/11   #4 Osteopenia/Osteoporosis  #5 lower back pain: Likely arthritis  PLAN:  #1 continue Arimidex 1 mg daily  #2 lower back  pain consider physical therapy but I will defer this to patient's primary care physician.  #3 I will see the patient back in 6 months time or sooner if need arises.   All questions were answered. The patient knows to call the clinic with any problems, questions or concerns. We can certainly see the patient much sooner if necessary.  I spent 15 minutes counseling the patient face to face. The total time spent in the appointment was 30 minutes.    Marcy Panning, MD Medical/Oncology [facility] [phone] (beeper) [phone] (Office)

## 2014-01-27 ENCOUNTER — Other Ambulatory Visit: Payer: Self-pay | Admitting: Oncology

## 2014-01-27 DIAGNOSIS — C50919 Malignant neoplasm of unspecified site of unspecified female breast: Secondary | ICD-10-CM

## 2014-02-08 ENCOUNTER — Other Ambulatory Visit: Payer: Self-pay | Admitting: Oncology

## 2014-02-08 DIAGNOSIS — Z853 Personal history of malignant neoplasm of breast: Secondary | ICD-10-CM

## 2014-03-15 ENCOUNTER — Ambulatory Visit
Admission: RE | Admit: 2014-03-15 | Discharge: 2014-03-15 | Disposition: A | Discharge status: Home / Self Care | Payer: BC Managed Care – PPO | Source: Ambulatory Visit | Attending: Oncology | Admitting: Oncology

## 2014-03-15 DIAGNOSIS — M810 Age-related osteoporosis without current pathological fracture: Secondary | ICD-10-CM

## 2014-03-15 DIAGNOSIS — Z853 Personal history of malignant neoplasm of breast: Secondary | ICD-10-CM

## 2014-03-24 ENCOUNTER — Telehealth: Payer: Self-pay | Admitting: *Deleted

## 2014-03-24 NOTE — Telephone Encounter (Signed)
Per Mendel Ryder, NP, I informed patient that her bone density results showed improvement. Mendel Ryder wants patient to continue taking Fosamax, Calcium, Vitamin D and doing weight bearing exercises. Patient verbalized understanding.

## 2014-06-21 ENCOUNTER — Ambulatory Visit (INDEPENDENT_AMBULATORY_CARE_PROVIDER_SITE_OTHER): Payer: BC Managed Care – PPO | Admitting: General Surgery

## 2014-07-05 ENCOUNTER — Ambulatory Visit (INDEPENDENT_AMBULATORY_CARE_PROVIDER_SITE_OTHER): Payer: BC Managed Care – PPO | Admitting: General Surgery

## 2014-07-05 ENCOUNTER — Encounter (INDEPENDENT_AMBULATORY_CARE_PROVIDER_SITE_OTHER): Payer: Self-pay | Admitting: General Surgery

## 2014-07-05 VITALS — BP 128/72 | HR 65 | Temp 98.0°F | Resp 18 | Ht 65.0 in | Wt 156.0 lb

## 2014-07-05 DIAGNOSIS — C50919 Malignant neoplasm of unspecified site of unspecified female breast: Secondary | ICD-10-CM

## 2014-07-05 DIAGNOSIS — C50912 Malignant neoplasm of unspecified site of left female breast: Secondary | ICD-10-CM

## 2014-07-05 NOTE — Patient Instructions (Signed)
Call if you find any new lumps in your breasts or chest wall. 

## 2014-07-05 NOTE — Progress Notes (Signed)
Operation:  Right partial mastectomy, right axillary sentinel lymph node biopsy. Reexcision right partial mastectomy site, Port-A-Cath insertion.  Date:  May 07, 2011. Second operation June 09, 2011.  Pathology: T2 N1a  HPI:  She is here for long-term follow up of her breast cancer. She is still taking Arimidex.  She denies any masses in her breasts or adenopathy. She is waiting to be assigned to a new medical oncologist.  Physical Exam: Right breast-upper outer and lateral scars are present; no masses are felt.  Left breast- Lateral scar is noted; no dominant masses present.  Lymph nodes-no axillary, supraclavicular, or cervical adenopathy.  Assessment:  T2 N1 right breast cancer-no clinical evidence of recurrence.    Plan: RTC one year.

## 2014-08-26 ENCOUNTER — Telehealth: Payer: Self-pay | Admitting: Hematology and Oncology

## 2014-08-26 NOTE — Telephone Encounter (Signed)
Lft msg for pt regarding labs/ov and MD change, mailed sch........ KJ °

## 2014-12-20 ENCOUNTER — Other Ambulatory Visit: Payer: Self-pay

## 2014-12-20 DIAGNOSIS — C50911 Malignant neoplasm of unspecified site of right female breast: Secondary | ICD-10-CM

## 2014-12-21 ENCOUNTER — Ambulatory Visit (HOSPITAL_BASED_OUTPATIENT_CLINIC_OR_DEPARTMENT_OTHER): Payer: BC Managed Care – PPO | Admitting: Hematology and Oncology

## 2014-12-21 ENCOUNTER — Telehealth: Payer: Self-pay | Admitting: Hematology and Oncology

## 2014-12-21 ENCOUNTER — Other Ambulatory Visit (HOSPITAL_BASED_OUTPATIENT_CLINIC_OR_DEPARTMENT_OTHER): Payer: BC Managed Care – PPO

## 2014-12-21 VITALS — BP 123/69 | HR 85 | Temp 98.9°F | Resp 20 | Ht 65.0 in | Wt 158.3 lb

## 2014-12-21 DIAGNOSIS — C50911 Malignant neoplasm of unspecified site of right female breast: Secondary | ICD-10-CM

## 2014-12-21 DIAGNOSIS — Z170 Estrogen receptor positive status [ER+]: Secondary | ICD-10-CM

## 2014-12-21 DIAGNOSIS — N61 Inflammatory disorders of breast: Secondary | ICD-10-CM

## 2014-12-21 LAB — COMPREHENSIVE METABOLIC PANEL (CC13)
ALBUMIN: 3.9 g/dL (ref 3.5–5.0)
ALT: 25 U/L (ref 0–55)
AST: 23 U/L (ref 5–34)
Alkaline Phosphatase: 86 U/L (ref 40–150)
Anion Gap: 9 mEq/L (ref 3–11)
BUN: 12.8 mg/dL (ref 7.0–26.0)
CO2: 27 meq/L (ref 22–29)
CREATININE: 0.8 mg/dL (ref 0.6–1.1)
Calcium: 9 mg/dL (ref 8.4–10.4)
Chloride: 105 mEq/L (ref 98–109)
EGFR: 74 mL/min/{1.73_m2} — AB (ref >90)
Glucose: 92 mg/dl (ref 70–140)
Potassium: 4.4 mEq/L (ref 3.5–5.1)
Sodium: 140 mEq/L (ref 136–145)
Total Bilirubin: 0.65 mg/dL (ref 0.20–1.20)
Total Protein: 6.2 g/dL — ABNORMAL LOW (ref 6.4–8.3)

## 2014-12-21 LAB — CBC WITH DIFFERENTIAL/PLATELET
BASO%: 0 % (ref 0.0–2.0)
Basophils Absolute: 0 10*3/uL (ref 0.0–0.1)
EOS%: 0.5 % (ref 0.0–7.0)
Eosinophils Absolute: 0 10*3/uL (ref 0.0–0.5)
HCT: 37.6 % (ref 34.8–46.6)
HEMOGLOBIN: 12.4 g/dL (ref 11.6–15.9)
LYMPH%: 21 % (ref 14.0–49.7)
MCH: 30.3 pg (ref 25.1–34.0)
MCHC: 33 g/dL (ref 31.5–36.0)
MCV: 91.9 fL (ref 79.5–101.0)
MONO#: 0.5 10*3/uL (ref 0.1–0.9)
MONO%: 8 % (ref 0.0–14.0)
NEUT#: 4.6 10*3/uL (ref 1.5–6.5)
NEUT%: 70.5 % (ref 38.4–76.8)
Platelets: 170 10*3/uL (ref 145–400)
RBC: 4.09 10*6/uL (ref 3.70–5.45)
RDW: 13.5 % (ref 11.2–14.5)
WBC: 6.5 10*3/uL (ref 3.9–10.3)
lymph#: 1.4 10*3/uL (ref 0.9–3.3)

## 2014-12-21 NOTE — Telephone Encounter (Signed)
, °

## 2014-12-21 NOTE — Assessment & Plan Note (Addendum)
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   Inflammation right breast: The entire right breast T1 extending into the medial aspect of the left breast is slightly red in color and it's tender to palpation throughout the right breast especially in the lateral aspect with warmth. This suggests inflammation. Her white blood cell count is normal. She has been started on cephalosporins. It appears that her symptoms may be slightly improved. I discussed with her that if her swelling does not get better and the redness does not resolve with the current antibiotics, I will then change antibiotics to Augmentin. If it gets markedly worse we might have to admit her to the hospital for IV antibiotics. We cannot do any investigations on the breasts until the inflammation subsides.  Arimidex toxicities: 1. Initially patient had muscle skeletal pain which subsided by moving the pill to morning 2. Osteoporosis bone mineral density done in April 2015 revealed T score -2.6 in the hips currently on bisphosphonate therapy with calcium and vitamin D I recommended continuing treatment for 5 years which was completed in December 2017. At that time we can consider doing breast cancer index to determine if she needs extended adjuvant therapy.  Breast cancer surveillance: 1. Breast exam 12/21/2014 is normal 2. Mammograms done 03/15/2014 is normal  Return to clinic in 6 months for follow-up

## 2014-12-22 NOTE — Progress Notes (Signed)
Patient Care Team: Raelene Bott, MD as PCP - General (Internal Medicine)  DIAGNOSIS: No matching staging information was found for the patient.  SUMMARY OF ONCOLOGIC HISTORY:   Malignant neoplasm of right breast   05/08/2011 Surgery Right breast lumpectomy: Invasive lobular carcinoma, grade 1, 2.2 cm, with LCIS, 1 sentinel node positive with focal extracapsular spread T2 N1 a M0 stage IIB. He had 97%, PR 0%, HER-2 negative ratio 0.85, Ki-67 5%, BUT score 26, 17% ROR   06/26/2011 - 08/28/2011 Chemotherapy Taxotere and Cytoxan 4 cycles   09/16/2011 - 10/22/2011 Radiation Therapy Adjuvant radiation therapy   11/04/2011 -  Anti-estrogen oral therapy Arimidex 1 mg daily    CHIEF COMPLIANT: Right breast swollen inflamed  INTERVAL HISTORY: Grace Carroll is a 64 year old lady who had a prior history of left-sided breast cancer and had a recurrent right-sided breast cancer in 2012 that was treated with lumpectomy followed by adjuvant chemotherapy and radiation and has been on oral antiestrogen therapy since December 2012. She has been doing fairly well up until recently when she noticed the right breast is red and swollen and tender. It started about couple of weeks ago and she had seen a primary care physician who started her on oral cephalosporins and it seems like it has gotten slightly better. she does not remember trauma or insect bites or anything else that could have caused this problem.  REVIEW OF SYSTEMS:   Constitutional: Denies fevers, chills or abnormal weight loss Eyes: Denies blurriness of vision Ears, nose, mouth, throat, and face: Denies mucositis or sore throat Respiratory: Denies cough, dyspnea or wheezes Cardiovascular: Denies palpitation, chest discomfort or lower extremity swelling Gastrointestinal:  Denies nausea, heartburn or change in bowel habits Skin: Denies abnormal skin rashes Lymphatics: Denies new lymphadenopathy or easy bruising Neurological:Denies numbness, tingling or  new weaknesses Behavioral/Psych: Mood is stable, no new changes  Breast: Pain redness and swelling of right breast All other systems were reviewed with the patient and are negative.  I have reviewed the past medical history, past surgical history, social history and family history with the patient and they are unchanged from previous note.  ALLERGIES:  is allergic to codeine; zofran; and gluten meal.  MEDICATIONS:  Current Outpatient Prescriptions  Medication Sig Dispense Refill  . alendronate (FOSAMAX) 70 MG tablet TAKE 1 TABLET BY MOUTH EVERY 7 DAYS. TAKE WITH A FULL GLASS OF WATER ON AN EMPTY STOMACH 12 tablet 3  . anastrozole (ARIMIDEX) 1 MG tablet TAKE 1 TABLET BY MOUTH EVERY DAY 30 tablet 10  . budesonide-formoterol (SYMBICORT) 160-4.5 MCG/ACT inhaler Inhale 2 puffs into the lungs 2 (two) times daily.    . Calcium Carbonate-Vitamin D (RA CALCIUM PLUS VITAMIN D) 600-200 MG-UNIT TABS Take by mouth daily.    . cefdinir (OMNICEF) 300 MG capsule Take 300 mg by mouth.    . cholecalciferol (VITAMIN D) 400 UNITS TABS Take 4,000 Units by mouth daily.     . ranitidine (ZANTAC) 300 MG tablet Take 300 mg by mouth as needed.      No current facility-administered medications for this visit.    PHYSICAL EXAMINATION: ECOG PERFORMANCE STATUS: 1 - Symptomatic but completely ambulatory  Filed Vitals:   12/21/14 1512  BP: 123/69  Pulse: 85  Temp: 98.9 F (37.2 C)  Resp: 20   Filed Weights   12/21/14 1512  Weight: 158 lb 4.8 oz (71.804 kg)    GENERAL:alert, no distress and comfortable SKIN: skin color, texture, turgor are normal, no rashes or  significant lesions EYES: normal, Conjunctiva are pink and non-injected, sclera clear OROPHARYNX:no exudate, no erythema and lips, buccal mucosa, and tongue normal  NECK: supple, thyroid normal size, non-tender, without nodularity LYMPH:  no palpable lymphadenopathy in the cervical, axillary or inguinal LUNGS: clear to auscultation and percussion  with normal breathing effort HEART: regular rate & rhythm and no murmurs and no lower extremity edema ABDOMEN:abdomen soft, non-tender and normal bowel sounds Musculoskeletal:no cyanosis of digits and no clubbing  NEURO: alert & oriented x 3 with fluent speech, no focal motor/sensory deficits BREAST: Moderate pain, pinkish discoloration to the skin from inflammation and swelling of the right breast extending almost to the medial aspect of the left breast. I could not do a proper examination for lumps or nodules because it is very tender to touch. No palpable axillary supraclavicular or infraclavicular adenopathy. (exam performed in the presence of a chaperone)  LABORATORY DATA:  I have reviewed the data as listed   Chemistry      Component Value Date/Time   NA 140 12/21/2014 1444   NA 141 05/21/2012 1028   K 4.4 12/21/2014 1444   K 4.9 05/21/2012 1028   CL 105 12/16/2012 1355   CL 106 05/21/2012 1028   CO2 27 12/21/2014 1444   CO2 26 05/21/2012 1028   BUN 12.8 12/21/2014 1444   BUN 14 05/21/2012 1028   CREATININE 0.8 12/21/2014 1444   CREATININE 0.80 05/21/2012 1028      Component Value Date/Time   CALCIUM 9.0 12/21/2014 1444   CALCIUM 9.5 05/21/2012 1028   ALKPHOS 86 12/21/2014 1444   ALKPHOS 81 05/21/2012 1028   AST 23 12/21/2014 1444   AST 16 05/21/2012 1028   ALT 25 12/21/2014 1444   ALT 13 05/21/2012 1028   BILITOT 0.65 12/21/2014 1444   BILITOT 0.6 05/21/2012 1028       Lab Results  Component Value Date   WBC 6.5 12/21/2014   HGB 12.4 12/21/2014   HCT 37.6 12/21/2014   MCV 91.9 12/21/2014   PLT 170 12/21/2014   NEUTROABS 4.6 12/21/2014     RADIOGRAPHIC STUDIES: I have personally reviewed the radiology reports and agreed with their findings. Mammograms done 03/15/2014 normal  ASSESSMENT & PLAN:  Malignant neoplasm of right breast Right breast invasive lobular carcinoma with lobular carcinoma in situ diagnosed in May 2012 status post lumpectomy which  showed T2 N1 M0 stage IIB ER/PR positive HER-2 negative status post radiation therapy and is currently on anastrozole 1 mg daily since 11/04/2011.   Inflammation right breast: The entire right breast T1 extending into the medial aspect of the left breast is slightly red in color and it's tender to palpation throughout the right breast especially in the lateral aspect with warmth. This suggests inflammation. Her white blood cell count is normal. She has been started on cephalosporins. It appears that her symptoms may be slightly improved. I discussed with her that if her swelling does not get better and the redness does not resolve with the current antibiotics, I will then change antibiotics to Augmentin. If it gets markedly worse we might have to admit her to the hospital for IV antibiotics. We cannot do any investigations on the breasts until the inflammation subsides.  Arimidex toxicities: 1. Initially patient had muscle skeletal pain which subsided by moving the pill to morning 2. Osteoporosis bone mineral density done in April 2015 revealed T score -2.6 in the hips currently on bisphosphonate therapy with calcium and vitamin D I recommended  continuing treatment for 5 years which was completed in December 2017. At that time we can consider doing breast cancer index to determine if she needs extended adjuvant therapy.  Breast cancer surveillance: 1. Breast exam 12/21/2014 could not be done properly because of extensive inflammation of the right breast. 2. Mammograms done 03/15/2014 is normal  Return to clinic in 6 months for follow-up. Patient will call me if the redness and pain in the breast does not get better.   No orders of the defined types were placed in this encounter.   The patient has a good understanding of the overall plan. she agrees with it. She will call with any problems that may develop before her next visit here.   Rulon Eisenmenger, MD

## 2015-01-30 ENCOUNTER — Other Ambulatory Visit: Payer: Self-pay | Admitting: Oncology

## 2015-02-07 ENCOUNTER — Other Ambulatory Visit: Payer: Self-pay | Admitting: General Surgery

## 2015-02-07 ENCOUNTER — Other Ambulatory Visit: Payer: Self-pay | Admitting: Internal Medicine

## 2015-02-07 DIAGNOSIS — Z853 Personal history of malignant neoplasm of breast: Secondary | ICD-10-CM

## 2015-02-16 ENCOUNTER — Other Ambulatory Visit: Payer: Self-pay | Admitting: Oncology

## 2015-02-19 NOTE — Telephone Encounter (Signed)
Last OV 12/21/14.  Next OV 06/01/15.  Chart reviewed

## 2015-03-19 ENCOUNTER — Ambulatory Visit
Admission: RE | Admit: 2015-03-19 | Discharge: 2015-03-19 | Disposition: A | Discharge status: Home / Self Care | Payer: BC Managed Care – PPO | Source: Ambulatory Visit | Attending: General Surgery | Admitting: General Surgery

## 2015-03-19 ENCOUNTER — Encounter (INDEPENDENT_AMBULATORY_CARE_PROVIDER_SITE_OTHER): Payer: Self-pay

## 2015-03-19 DIAGNOSIS — Z853 Personal history of malignant neoplasm of breast: Secondary | ICD-10-CM

## 2015-06-20 ENCOUNTER — Telehealth: Payer: Self-pay | Admitting: Hematology and Oncology

## 2015-06-20 ENCOUNTER — Other Ambulatory Visit: Payer: Self-pay | Admitting: *Deleted

## 2015-06-20 DIAGNOSIS — C50911 Malignant neoplasm of unspecified site of right female breast: Secondary | ICD-10-CM

## 2015-06-20 NOTE — Telephone Encounter (Signed)
Returned Advertising account executive. Left message confirming appointment for 07/21 per voicemail left.

## 2015-06-21 ENCOUNTER — Telehealth: Payer: Self-pay | Admitting: Hematology and Oncology

## 2015-06-21 ENCOUNTER — Encounter: Payer: Self-pay | Admitting: Hematology and Oncology

## 2015-06-21 ENCOUNTER — Ambulatory Visit (HOSPITAL_BASED_OUTPATIENT_CLINIC_OR_DEPARTMENT_OTHER): Payer: BC Managed Care – PPO | Admitting: Hematology and Oncology

## 2015-06-21 ENCOUNTER — Other Ambulatory Visit (HOSPITAL_BASED_OUTPATIENT_CLINIC_OR_DEPARTMENT_OTHER): Payer: BC Managed Care – PPO

## 2015-06-21 VITALS — BP 132/69 | HR 79 | Temp 99.0°F | Resp 18 | Ht 65.0 in | Wt 159.7 lb

## 2015-06-21 DIAGNOSIS — M810 Age-related osteoporosis without current pathological fracture: Secondary | ICD-10-CM | POA: Diagnosis not present

## 2015-06-21 DIAGNOSIS — C773 Secondary and unspecified malignant neoplasm of axilla and upper limb lymph nodes: Secondary | ICD-10-CM

## 2015-06-21 DIAGNOSIS — C50911 Malignant neoplasm of unspecified site of right female breast: Secondary | ICD-10-CM

## 2015-06-21 DIAGNOSIS — M898X9 Other specified disorders of bone, unspecified site: Secondary | ICD-10-CM | POA: Diagnosis not present

## 2015-06-21 LAB — COMPREHENSIVE METABOLIC PANEL (CC13)
ALT: 15 U/L (ref 0–55)
AST: 15 U/L (ref 5–34)
Albumin: 4.2 g/dL (ref 3.5–5.0)
Alkaline Phosphatase: 68 U/L (ref 40–150)
Anion Gap: 8 mEq/L (ref 3–11)
BUN: 18 mg/dL (ref 7.0–26.0)
CO2: 26 mEq/L (ref 22–29)
CREATININE: 1 mg/dL (ref 0.6–1.1)
Calcium: 9.4 mg/dL (ref 8.4–10.4)
Chloride: 105 mEq/L (ref 98–109)
EGFR: 60 mL/min/{1.73_m2} — AB (ref >90)
GLUCOSE: 109 mg/dL (ref 70–140)
Potassium: 4.8 mEq/L (ref 3.5–5.1)
Sodium: 139 mEq/L (ref 136–145)
TOTAL PROTEIN: 6.6 g/dL (ref 6.4–8.3)
Total Bilirubin: 0.55 mg/dL (ref 0.20–1.20)

## 2015-06-21 LAB — CBC WITH DIFFERENTIAL/PLATELET
BASO%: 0.1 % (ref 0.0–2.0)
Basophils Absolute: 0 10*3/uL (ref 0.0–0.1)
EOS ABS: 0 10*3/uL (ref 0.0–0.5)
EOS%: 0 % (ref 0.0–7.0)
HCT: 40.4 % (ref 34.8–46.6)
HEMOGLOBIN: 13.4 g/dL (ref 11.6–15.9)
LYMPH%: 10.3 % — ABNORMAL LOW (ref 14.0–49.7)
MCH: 30.2 pg (ref 25.1–34.0)
MCHC: 33.2 g/dL (ref 31.5–36.0)
MCV: 91 fL (ref 79.5–101.0)
MONO#: 0.3 10*3/uL (ref 0.1–0.9)
MONO%: 3.2 % (ref 0.0–14.0)
NEUT#: 7.2 10*3/uL — ABNORMAL HIGH (ref 1.5–6.5)
NEUT%: 86.4 % — ABNORMAL HIGH (ref 38.4–76.8)
PLATELETS: 207 10*3/uL (ref 145–400)
RBC: 4.44 10*6/uL (ref 3.70–5.45)
RDW: 13.3 % (ref 11.2–14.5)
WBC: 8.4 10*3/uL (ref 3.9–10.3)
lymph#: 0.9 10*3/uL (ref 0.9–3.3)

## 2015-06-21 NOTE — Assessment & Plan Note (Signed)
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   Right breast inflammation: Subsided  Arimidex toxicities: 1. Initially patient had muscle skeletal pain which subsided by moving the pill to morning 2. Osteoporosis bone mineral density done in April 2015 revealed T score -2.6 in the hips currently on bisphosphonate therapy with calcium and vitamin D I recommended continuing treatment for 5 years which was completed in December 2017. At that time we can consider doing breast cancer index to determine if she needs extended adjuvant therapy.  Breast Cancer Surveillance: 1. Breast exam 06/21/2015: Normal 2. Mammogram 03/19/2015 No abnormalities. Postsurgical changes. Breast Density Category B. I recommended that she get 3-D mammograms for surveillance. Discussed the differences between different breast density categories.  Return to clinic in 1 year for follow-up    

## 2015-06-21 NOTE — Progress Notes (Signed)
Patient Care Team: Raelene Bott, MD as PCP - General (Internal Medicine)  DIAGNOSIS: No matching staging information was found for the patient.  SUMMARY OF ONCOLOGIC HISTORY:   Malignant neoplasm of right breast   05/08/2011 Surgery Right breast lumpectomy: Invasive lobular carcinoma, grade 1, 2.2 cm, with LCIS, 1 sentinel node positive with focal extracapsular spread T2 N1 a M0 stage IIB. He had 97%, PR 0%, HER-2 negative ratio 0.85, Ki-67 5%, BUT score 26, 17% ROR   06/26/2011 - 08/28/2011 Chemotherapy Taxotere and Cytoxan 4 cycles   09/16/2011 - 10/22/2011 Radiation Therapy Adjuvant radiation therapy   11/04/2011 -  Anti-estrogen oral therapy Arimidex 1 mg daily    CHIEF COMPLIANT: Right breast inflammation subsided  INTERVAL HISTORY: Grace Carroll is a 64 year old with above-mentioned history of right-sided breast lumpectomy followed by adjuvant chemotherapy and radiation and is currently on Arimidex since 2012. She is tolerating it very well without any major problems or concerns. She had significant inflammation the right breast which finally subsided with antibiotics. She had a mammogram in April which was normal. She complains of bone pain especially in the back and ribs.  REVIEW OF SYSTEMS:   Constitutional: Denies fevers, chills or abnormal weight loss Eyes: Denies blurriness of vision Ears, nose, mouth, throat, and face: Denies mucositis or sore throat Respiratory: Denies cough, dyspnea or wheezes Cardiovascular: Denies palpitation, chest discomfort or lower extremity swelling Gastrointestinal:  Denies nausea, heartburn or change in bowel habits Skin: Denies abnormal skin rashes Lymphatics: Denies new lymphadenopathy or easy bruising Neurological:Denies numbness, tingling or new weaknesses Behavioral/Psych: Mood is stable, no new changes  Breast:  denies any pain or lumps or nodules in either breasts All other systems were reviewed with the patient and are negative.  I have  reviewed the past medical history, past surgical history, social history and family history with the patient and they are unchanged from previous note.  ALLERGIES:  is allergic to codeine; zofran; and gluten meal.  MEDICATIONS:  Current Outpatient Prescriptions  Medication Sig Dispense Refill  . alendronate (FOSAMAX) 70 MG tablet TAKE 1 TABLET BY MOUTH EVERY 7 DAYS WITH A FULL GLASS OF WATER ON AN EMPTY STOMACH 12 tablet 3  . anastrozole (ARIMIDEX) 1 MG tablet TAKE 1 TABLET BY MOUTH EVERY DAY 30 tablet 5  . budesonide-formoterol (SYMBICORT) 160-4.5 MCG/ACT inhaler Inhale 2 puffs into the lungs 2 (two) times daily.    . Calcium Carbonate-Vitamin D (RA CALCIUM PLUS VITAMIN D) 600-200 MG-UNIT TABS Take by mouth daily.    . cholecalciferol (VITAMIN D) 400 UNITS TABS Take 4,000 Units by mouth daily.     . ranitidine (ZANTAC) 300 MG tablet Take 300 mg by mouth as needed.      No current facility-administered medications for this visit.    PHYSICAL EXAMINATION: ECOG PERFORMANCE STATUS: 1 - Symptomatic but completely ambulatory  Filed Vitals:   06/21/15 1345  BP: 132/69  Pulse: 79  Temp: 99 F (37.2 C)  Resp: 18   Filed Weights   06/21/15 1345  Weight: 159 lb 11.2 oz (72.439 kg)    GENERAL:alert, no distress and comfortable SKIN: skin color, texture, turgor are normal, no rashes or significant lesions EYES: normal, Conjunctiva are pink and non-injected, sclera clear OROPHARYNX:no exudate, no erythema and lips, buccal mucosa, and tongue normal  NECK: supple, thyroid normal size, non-tender, without nodularity LYMPH:  no palpable lymphadenopathy in the cervical, axillary or inguinal LUNGS: clear to auscultation and percussion with normal breathing effort HEART: regular rate &  rhythm and no murmurs and no lower extremity edema ABDOMEN:abdomen soft, non-tender and normal bowel sounds Musculoskeletal:no cyanosis of digits and no clubbing  NEURO: alert & oriented x 3 with fluent speech,  no focal motor/sensory deficits BREAST: No palpable masses or nodules in either right or left breasts. No palpable axillary supraclavicular or infraclavicular adenopathy no breast tenderness or nipple discharge. (exam performed in the presence of a chaperone)  LABORATORY DATA:  I have reviewed the data as listed   Chemistry      Component Value Date/Time   NA 140 12/21/2014 1444   NA 141 05/21/2012 1028   K 4.4 12/21/2014 1444   K 4.9 05/21/2012 1028   CL 105 12/16/2012 1355   CL 106 05/21/2012 1028   CO2 27 12/21/2014 1444   CO2 26 05/21/2012 1028   BUN 12.8 12/21/2014 1444   BUN 14 05/21/2012 1028   CREATININE 0.8 12/21/2014 1444   CREATININE 0.80 05/21/2012 1028      Component Value Date/Time   CALCIUM 9.0 12/21/2014 1444   CALCIUM 9.5 05/21/2012 1028   ALKPHOS 86 12/21/2014 1444   ALKPHOS 81 05/21/2012 1028   AST 23 12/21/2014 1444   AST 16 05/21/2012 1028   ALT 25 12/21/2014 1444   ALT 13 05/21/2012 1028   BILITOT 0.65 12/21/2014 1444   BILITOT 0.6 05/21/2012 1028       Lab Results  Component Value Date   WBC 8.4 06/21/2015   HGB 13.4 06/21/2015   HCT 40.4 06/21/2015   MCV 91.0 06/21/2015   PLT 207 06/21/2015   NEUTROABS 7.2* 06/21/2015   ASSESSMENT & PLAN:  Malignant neoplasm of right breast 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   Right breast inflammation: Subsided  Arimidex toxicities: 1. Initially patient had muscle skeletal pain which subsided by moving the pill to morning 2. Osteoporosis bone mineral density done in April 2015 revealed T score -2.6 in the hips currently on bisphosphonate therapy with calcium and vitamin D I recommended continuing treatment for 5 years which was completed in December 2017. At that time we can  consider doing breast cancer index to determine if she needs extended adjuvant therapy.  Breast Cancer Surveillance: 1. Breast exam 06/21/2015: Normal 2. Mammogram 03/19/2015 No abnormalities. Postsurgical changes. Breast Density Category B. I recommended that she get 3-D mammograms for surveillance. Discussed the differences between different breast density categories.  Bone pain especially in the ribs and lower back: We will obtain an bone scan. I will call her with the result of this test. If it is normal she can come back and see [place] in one year.  Return to clinic in 1 year for follow-up   No orders of the defined types were placed in this encounter.   The patient has a good understanding of the overall plan. she agrees with it. she will call with any problems that may develop before the next visit here.   Rulon Eisenmenger, MD

## 2015-06-21 NOTE — Telephone Encounter (Signed)
Pt confirmed MD visit per 07/21 POF, gave pt AVS and Calendar... KJ

## 2015-07-03 ENCOUNTER — Encounter (HOSPITAL_COMMUNITY)
Admission: RE | Admit: 2015-07-03 | Discharge: 2015-07-03 | Disposition: A | Discharge status: Home / Self Care | Payer: BC Managed Care – PPO | Source: Ambulatory Visit | Attending: Hematology and Oncology | Admitting: Hematology and Oncology

## 2015-07-03 DIAGNOSIS — C50911 Malignant neoplasm of unspecified site of right female breast: Secondary | ICD-10-CM | POA: Insufficient documentation

## 2015-07-03 MED ORDER — TECHNETIUM TC 99M MEDRONATE IV KIT
26.1000 | PACK | Freq: Once | INTRAVENOUS | Status: AC | PRN
  Administered 2015-07-03: 26.1 via INTRAVENOUS

## 2015-09-29 ENCOUNTER — Other Ambulatory Visit: Payer: Self-pay | Admitting: Hematology and Oncology

## 2015-09-29 NOTE — Telephone Encounter (Signed)
Chart reviewed.

## 2016-01-13 ENCOUNTER — Other Ambulatory Visit: Payer: Self-pay | Admitting: Hematology and Oncology

## 2016-01-14 NOTE — Telephone Encounter (Signed)
Chart reviewed.

## 2016-03-12 ENCOUNTER — Other Ambulatory Visit: Payer: Self-pay | Admitting: General Surgery

## 2016-03-12 DIAGNOSIS — Z853 Personal history of malignant neoplasm of breast: Secondary | ICD-10-CM

## 2016-03-24 ENCOUNTER — Ambulatory Visit
Admission: RE | Admit: 2016-03-24 | Discharge: 2016-03-24 | Disposition: A | Discharge status: Home / Self Care | Payer: BC Managed Care – PPO | Source: Ambulatory Visit | Attending: General Surgery | Admitting: General Surgery

## 2016-03-24 DIAGNOSIS — Z853 Personal history of malignant neoplasm of breast: Secondary | ICD-10-CM

## 2016-04-30 ENCOUNTER — Other Ambulatory Visit: Payer: Self-pay | Admitting: Hematology and Oncology

## 2016-06-20 ENCOUNTER — Ambulatory Visit (HOSPITAL_BASED_OUTPATIENT_CLINIC_OR_DEPARTMENT_OTHER): Payer: BC Managed Care – PPO | Admitting: Hematology and Oncology

## 2016-06-20 ENCOUNTER — Encounter: Payer: Self-pay | Admitting: Hematology and Oncology

## 2016-06-20 ENCOUNTER — Telehealth: Payer: Self-pay | Admitting: Hematology and Oncology

## 2016-06-20 VITALS — BP 131/64 | HR 80 | Temp 98.4°F | Resp 17 | Wt 163.0 lb

## 2016-06-20 DIAGNOSIS — R05 Cough: Secondary | ICD-10-CM | POA: Diagnosis not present

## 2016-06-20 DIAGNOSIS — M898X0 Other specified disorders of bone, multiple sites: Secondary | ICD-10-CM | POA: Diagnosis not present

## 2016-06-20 DIAGNOSIS — C50511 Malignant neoplasm of lower-outer quadrant of right female breast: Secondary | ICD-10-CM

## 2016-06-20 DIAGNOSIS — M810 Age-related osteoporosis without current pathological fracture: Secondary | ICD-10-CM | POA: Diagnosis not present

## 2016-06-20 NOTE — Progress Notes (Signed)
Patient Care Team: Raelene Bott, MD as PCP - General (Internal Medicine)  SUMMARY OF ONCOLOGIC HISTORY:   Breast cancer of lower-outer quadrant of right female breast ([place])   05/08/2011 Surgery Right breast lumpectomy: Invasive lobular carcinoma, grade 1, 2.2 cm, with LCIS, 1 sentinel node positive with focal extracapsular spread T2 N1 a M0 stage IIB. He had 97%, PR 0%, HER-2 negative ratio 0.85, Ki-67 5%, BUT score 26, 17% ROR   06/26/2011 - 08/28/2011 Chemotherapy Taxotere and Cytoxan 4 cycles   09/16/2011 - 10/22/2011 Radiation Therapy Adjuvant radiation therapy   11/04/2011 -  Anti-estrogen oral therapy Arimidex 1 mg daily    CHIEF COMPLIANT: Follow-up on Arimidex  INTERVAL HISTORY: Grace Carroll is a 65 year old above-mentioned history of right breast cancer currently on antiestrogen therapy with Arimidex. She does have diffuse muscle aches and pains especially in the thighs and knees. She has gone through multiple tests recently including a stress test which was normal and an upper endoscopy yesterday which apparently showed a gastric ulcer and she is now on a new medication for that. She continues to have chronic dry cough. Denies any fevers or chills. She will be completing 5 years of therapy by summer 2017.  REVIEW OF SYSTEMS:   Constitutional: Denies fevers, chills or abnormal weight loss Eyes: Denies blurriness of vision Ears, nose, mouth, throat, and face: Denies mucositis or sore throat Respiratory: Denies cough, dyspnea or wheezes Cardiovascular: Denies palpitation, chest discomfort Gastrointestinal:  Denies nausea, heartburn or change in bowel habits Skin: Denies abnormal skin rashes Lymphatics: Denies new lymphadenopathy or easy bruising Neurological:Denies numbness, tingling or new weaknesses Behavioral/Psych: Mood is stable, no new changes  Extremities: No lower extremity edema Breast:  denies any pain or lumps or nodules in either breasts All other systems were reviewed  with the patient and are negative.  I have reviewed the past medical history, past surgical history, social history and family history with the patient and they are unchanged from previous note.  ALLERGIES:  is allergic to codeine; zofran; and gluten meal.  MEDICATIONS:  Current Outpatient Prescriptions  Medication Sig Dispense Refill  . alendronate (FOSAMAX) 70 MG tablet TAKE 1 TABLET BY MOUTH EVERY 7 DAYS WITH A FULL GLASS OF WATER ON AN EMPTY STOMACH 12 tablet 3  . anastrozole (ARIMIDEX) 1 MG tablet TAKE 1 TABLET BY MOUTH EVERY DAY 30 tablet 5  . budesonide-formoterol (SYMBICORT) 160-4.5 MCG/ACT inhaler Inhale 2 puffs into the lungs 2 (two) times daily.    . Calcium Carbonate-Vitamin D (RA CALCIUM PLUS VITAMIN D) 600-200 MG-UNIT TABS Take by mouth daily.    . cholecalciferol (VITAMIN D) 400 UNITS TABS Take 4,000 Units by mouth daily.     . ranitidine (ZANTAC) 300 MG tablet Take 300 mg by mouth as needed.      No current facility-administered medications for this visit.    PHYSICAL EXAMINATION: ECOG PERFORMANCE STATUS: 0 - Asymptomatic  Filed Vitals:   06/20/16 0836  BP: 131/64  Pulse: 80  Temp: 98.4 F (36.9 C)  Resp: 17   Filed Weights   06/20/16 0836  Weight: 163 lb (73.936 kg)    GENERAL:alert, no distress and comfortable SKIN: skin color, texture, turgor are normal, no rashes or significant lesions EYES: normal, Conjunctiva are pink and non-injected, sclera clear OROPHARYNX:no exudate, no erythema and lips, buccal mucosa, and tongue normal  NECK: supple, thyroid normal size, non-tender, without nodularity LYMPH:  no palpable lymphadenopathy in the cervical, axillary or inguinal LUNGS: clear to  auscultation and percussion with normal breathing effort HEART: regular rate & rhythm and no murmurs and no lower extremity edema ABDOMEN:abdomen soft, non-tender and normal bowel sounds MUSCULOSKELETAL:no cyanosis of digits and no clubbing  NEURO: alert & oriented x 3 with  fluent speech, no focal motor/sensory deficits EXTREMITIES: No lower extremity edema BREAST: No palpable masses or nodules in either right or left breasts. Scar tissue from previous surgeries and tenderness in the right breast are noted No palpable axillary supraclavicular or infraclavicular adenopathy no breast tenderness or nipple discharge. (exam performed in the presence of a chaperone)  LABORATORY DATA:  I have reviewed the data as listed   Chemistry      Component Value Date/Time   NA 139 06/21/2015 1334   NA 141 05/21/2012 1028   K 4.8 06/21/2015 1334   K 4.9 05/21/2012 1028   CL 105 12/16/2012 1355   CL 106 05/21/2012 1028   CO2 26 06/21/2015 1334   CO2 26 05/21/2012 1028   BUN 18.0 06/21/2015 1334   BUN 14 05/21/2012 1028   CREATININE 1.0 06/21/2015 1334   CREATININE 0.80 05/21/2012 1028      Component Value Date/Time   CALCIUM 9.4 06/21/2015 1334   CALCIUM 9.5 05/21/2012 1028   ALKPHOS 68 06/21/2015 1334   ALKPHOS 81 05/21/2012 1028   AST 15 06/21/2015 1334   AST 16 05/21/2012 1028   ALT 15 06/21/2015 1334   ALT 13 05/21/2012 1028   BILITOT 0.55 06/21/2015 1334   BILITOT 0.6 05/21/2012 1028       Lab Results  Component Value Date   WBC 8.4 06/21/2015   HGB 13.4 06/21/2015   HCT 40.4 06/21/2015   MCV 91.0 06/21/2015   PLT 207 06/21/2015   NEUTROABS 7.2* 06/21/2015     ASSESSMENT & PLAN:  Breast cancer of lower-outer quadrant of right female breast ([place]) 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   Right breast inflammation: Subsided  Arimidex toxicities: 1. Initially patient had muscle skeletal pain which subsided by moving the pill to morning 2. Osteoporosis bone mineral density done in April 2015 revealed T score -2.6 in the  hips currently on bisphosphonate therapy with calcium and vitamin D I recommended continuing treatment for 5 years which was completed in December 2017. At that time we can consider doing breast cancer index to determine if she needs extended adjuvant therapy.  Breast Cancer Surveillance: 1. Breast exam 06/20/2016: Normal 2. Mammogram 03/24/2016 No abnormalities. Postsurgical changes. Breast Density Category B. I recommended that she get 3-D mammograms for surveillance. Discussed the differences between different breast density categories.  Bone pain especially in the ribs and lower back: Bone scan 07/2015 normal  Return to clinic in 1 year and I will call her with the results of breast cancer index.  No orders of the defined types were placed in this encounter.   The patient has a good understanding of the overall plan. she agrees with it. she will call with any problems that may develop before the next visit here.   Rulon Eisenmenger, MD 06/20/2016

## 2016-06-20 NOTE — Telephone Encounter (Signed)
Gave patient avs report and appointments for July 2018.  °

## 2016-06-20 NOTE — Assessment & Plan Note (Signed)
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   Right breast inflammation: Subsided  Arimidex toxicities: 1. Initially patient had muscle skeletal pain which subsided by moving the pill to morning 2. Osteoporosis bone mineral density done in April 2015 revealed T score -2.6 in the hips currently on bisphosphonate therapy with calcium and vitamin D I recommended continuing treatment for 5 years which was completed in December 2017. At that time we can consider doing breast cancer index to determine if she needs extended adjuvant therapy.  Breast Cancer Surveillance: 1. Breast exam 06/20/2016: Normal 2. Mammogram 03/24/2016 No abnormalities. Postsurgical changes. Breast Density Category B. I recommended that she get 3-D mammograms for surveillance. Discussed the differences between different breast density categories.  Bone pain especially in the ribs and lower back: Bone scan 8 2016 normal  Return to clinic in 1 year and I will call her with the results of breast cancer index.

## 2016-06-23 ENCOUNTER — Telehealth: Payer: Self-pay | Admitting: *Deleted

## 2016-06-23 NOTE — Telephone Encounter (Signed)
Ordered BCI per Dr. Lindi Adie. Faxed requisition to [facility].

## 2016-07-10 ENCOUNTER — Telehealth: Payer: Self-pay | Admitting: *Deleted

## 2016-07-10 NOTE — Telephone Encounter (Signed)
Received Breast Cancer Index score of Low Risk.  Placed a copy in Dr. Geralyn Flash box, gave Varney Biles a copy and took a copy to HIM to scan.

## 2016-07-11 ENCOUNTER — Telehealth: Payer: Self-pay | Admitting: Hematology and Oncology

## 2016-07-11 NOTE — Telephone Encounter (Signed)
I called the patient to give the result of breast cancer index There is cancer index revealed that her risk of recurrence is at 1.4% between year 5 year 10. Although the test also suggest that she has high likelihood of benefit from extended adjuvant therapy, because of the low risk of recurrence I recommended to discontinue antiestrogen therapy.  Patient continues to have bone aches and pains even though she's been off of this medicine for a month. I asked her to discuss with her primary care physician if the symptoms still do not get better after another month of staying off this medicine. She may need a referral to rheumatologist.

## 2016-07-15 ENCOUNTER — Encounter (HOSPITAL_COMMUNITY): Payer: Self-pay

## 2016-10-13 DIAGNOSIS — M51369 Other intervertebral disc degeneration, lumbar region without mention of lumbar back pain or lower extremity pain: Secondary | ICD-10-CM | POA: Insufficient documentation

## 2016-10-13 DIAGNOSIS — M5136 Other intervertebral disc degeneration, lumbar region: Secondary | ICD-10-CM | POA: Insufficient documentation

## 2017-03-03 ENCOUNTER — Other Ambulatory Visit: Payer: Self-pay | Admitting: Hematology and Oncology

## 2017-03-03 DIAGNOSIS — Z853 Personal history of malignant neoplasm of breast: Secondary | ICD-10-CM

## 2017-03-06 ENCOUNTER — Other Ambulatory Visit: Payer: Self-pay | Admitting: Emergency Medicine

## 2017-03-06 MED ORDER — ALENDRONATE SODIUM 70 MG PO TABS: ORAL_TABLET | ORAL | 3 refills | Status: DC

## 2017-03-25 ENCOUNTER — Ambulatory Visit
Admission: RE | Admit: 2017-03-25 | Discharge: 2017-03-25 | Disposition: A | Discharge status: Home / Self Care | Payer: BC Managed Care – PPO | Source: Ambulatory Visit | Attending: Hematology and Oncology | Admitting: Hematology and Oncology

## 2017-03-25 DIAGNOSIS — Z853 Personal history of malignant neoplasm of breast: Secondary | ICD-10-CM

## 2017-03-25 HISTORY — DX: Personal history of antineoplastic chemotherapy: Z92.21

## 2017-03-25 HISTORY — DX: Personal history of irradiation: Z92.3

## 2017-06-19 ENCOUNTER — Encounter: Payer: Self-pay | Admitting: Hematology and Oncology

## 2017-06-19 ENCOUNTER — Ambulatory Visit (HOSPITAL_BASED_OUTPATIENT_CLINIC_OR_DEPARTMENT_OTHER): Payer: BC Managed Care – PPO | Admitting: Hematology and Oncology

## 2017-06-19 DIAGNOSIS — Z9221 Personal history of antineoplastic chemotherapy: Secondary | ICD-10-CM | POA: Diagnosis not present

## 2017-06-19 DIAGNOSIS — Z923 Personal history of irradiation: Secondary | ICD-10-CM | POA: Diagnosis not present

## 2017-06-19 DIAGNOSIS — Z170 Estrogen receptor positive status [ER+]: Secondary | ICD-10-CM | POA: Diagnosis not present

## 2017-06-19 DIAGNOSIS — M810 Age-related osteoporosis without current pathological fracture: Secondary | ICD-10-CM | POA: Diagnosis not present

## 2017-06-19 DIAGNOSIS — M898X0 Other specified disorders of bone, multiple sites: Secondary | ICD-10-CM | POA: Diagnosis not present

## 2017-06-19 DIAGNOSIS — C50511 Malignant neoplasm of lower-outer quadrant of right female breast: Secondary | ICD-10-CM | POA: Diagnosis not present

## 2017-06-19 DIAGNOSIS — Z79811 Long term (current) use of aromatase inhibitors: Secondary | ICD-10-CM

## 2017-06-19 NOTE — Assessment & Plan Note (Signed)
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 Breast exam 06/19/2017: Normal 2. Mammogram 03/25/2017 No abnormalities. Postsurgical changes. Breast Density Category B.  Bone pain especially in the ribs and lower back: Bone scan 07/2015 normal  Return to clinic in 1 year with survivorship clinic

## 2017-06-19 NOTE — Progress Notes (Signed)
Patient Care Team: Raelene Bott, MD as PCP - General (Internal Medicine)  DIAGNOSIS:  Encounter Diagnosis  Name Primary?  . Malignant neoplasm of lower-outer quadrant of right breast of female, estrogen receptor positive ([place])     SUMMARY OF ONCOLOGIC HISTORY:   Breast cancer of lower-outer quadrant of right female breast ([place])   05/08/2011 Surgery    Right breast lumpectomy: Invasive lobular carcinoma, grade 1, 2.2 cm, with LCIS, 1 sentinel node positive with focal extracapsular spread T2 N1 a M0 stage IIB. He had 97%, PR 0%, HER-2 negative ratio 0.85, Ki-67 5%, BUT score 26, 17% ROR      06/26/2011 - 08/28/2011 Chemotherapy    Taxotere and Cytoxan 4 cycles      09/16/2011 - 10/22/2011 Radiation Therapy    Adjuvant radiation therapy      11/04/2011 - 07/11/2016 Anti-estrogen oral therapy    Arimidex 1 mg daily (BCI Low risk)       CHIEF COMPLIANT: Surveillance of breast cancer  INTERVAL HISTORY: Grace Carroll is a 66 year old with above-mentioned history of right breast cancer treated with chemotherapy radiation and antiestrogen therapy for 5 years. She completed 5 years of therapy after finding out that the breast cancer index was low risk. She did not need to stay on it beyond 5 years. She is doing quite well from general health standpoint. She stays active by walking regularly. Denies any lumps or nodules in the breast. Denies any pain or discomfort. Breast swelling has subsided.  REVIEW OF SYSTEMS:   Constitutional: Denies fevers, chills or abnormal weight loss Eyes: Denies blurriness of vision Ears, nose, mouth, throat, and face: Denies mucositis or sore throat Respiratory: Denies cough, dyspnea or wheezes Cardiovascular: Denies palpitation, chest discomfort Gastrointestinal:  Denies nausea, heartburn or change in bowel habits Skin: Denies abnormal skin rashes Lymphatics: Denies new lymphadenopathy or easy bruising Neurological:Denies numbness, tingling or new  weaknesses Behavioral/Psych: Mood is stable, no new changes  Extremities: No lower extremity edema Breast:  denies any pain or lumps or nodules in either breasts All other systems were reviewed with the patient and are negative.  I have reviewed the past medical history, past surgical history, social history and family history with the patient and they are unchanged from previous note.  ALLERGIES:  is allergic to codeine; zofran; and gluten meal.  MEDICATIONS:  Current Outpatient Prescriptions  Medication Sig Dispense Refill  . alendronate (FOSAMAX) 70 MG tablet TAKE 1 TABLET BY MOUTH EVERY 7 DAYS WITH A FULL GLASS OF WATER ON AN EMPTY STOMACH 12 tablet 3  . anastrozole (ARIMIDEX) 1 MG tablet TAKE 1 TABLET BY MOUTH EVERY DAY 30 tablet 5  . budesonide-formoterol (SYMBICORT) 160-4.5 MCG/ACT inhaler Inhale 2 puffs into the lungs 2 (two) times daily.    . Calcium Carbonate-Vitamin D (RA CALCIUM PLUS VITAMIN D) 600-200 MG-UNIT TABS Take by mouth daily.    . cholecalciferol (VITAMIN D) 400 UNITS TABS Take 4,000 Units by mouth daily.     . ranitidine (ZANTAC) 300 MG tablet Take 300 mg by mouth as needed.      No current facility-administered medications for this visit.     PHYSICAL EXAMINATION: ECOG PERFORMANCE STATUS: 0 - Asymptomatic  Vitals:   06/19/17 0829  BP: (!) 151/68  Pulse: 66  Resp: 20  Temp: 97.8 F (36.6 C)   Filed Weights   06/19/17 0829  Weight: 156 lb 8 oz (71 kg)    GENERAL:alert, no distress and comfortable SKIN: skin color, texture,  turgor are normal, no rashes or significant lesions EYES: normal, Conjunctiva are pink and non-injected, sclera clear OROPHARYNX:no exudate, no erythema and lips, buccal mucosa, and tongue normal  NECK: supple, thyroid normal size, non-tender, without nodularity LYMPH:  no palpable lymphadenopathy in the cervical, axillary or inguinal LUNGS: clear to auscultation and percussion with normal breathing effort HEART: regular rate &  rhythm and no murmurs and no lower extremity edema ABDOMEN:abdomen soft, non-tender and normal bowel sounds MUSCULOSKELETAL:no cyanosis of digits and no clubbing  NEURO: alert & oriented x 3 with fluent speech, no focal motor/sensory deficits EXTREMITIES: No lower extremity edema BREAST: No palpable masses or nodules in either right or left breasts. No palpable axillary supraclavicular or infraclavicular adenopathy no breast tenderness or nipple discharge. (exam performed in the presence of a chaperone)  LABORATORY DATA:  I have reviewed the data as listed   Chemistry      Component Value Date/Time   NA 139 06/21/2015 1334   K 4.8 06/21/2015 1334   CL 105 12/16/2012 1355   CO2 26 06/21/2015 1334   BUN 18.0 06/21/2015 1334   CREATININE 1.0 06/21/2015 1334      Component Value Date/Time   CALCIUM 9.4 06/21/2015 1334   ALKPHOS 68 06/21/2015 1334   AST 15 06/21/2015 1334   ALT 15 06/21/2015 1334   BILITOT 0.55 06/21/2015 1334       Lab Results  Component Value Date   WBC 8.4 06/21/2015   HGB 13.4 06/21/2015   HCT 40.4 06/21/2015   MCV 91.0 06/21/2015   PLT 207 06/21/2015   NEUTROABS 7.2 (H) 06/21/2015    ASSESSMENT & PLAN:  Breast cancer of lower-outer quadrant of right female breast ([place]) 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 Breast exam 06/19/2017: Normal 2. Mammogram 03/25/2017 No abnormalities. Postsurgical changes. Breast Density Category B.  Bone pain especially in the ribs and lower  back: Bone scan 07/2015 normal  Return to clinic in 1 year with survivorship clinic   I spent 25 minutes talking to the patient of which more than half was spent in counseling and coordination of care.  No orders of the defined types were placed in this encounter.  The patient has a good understanding of the overall plan. she agrees with it. she will call with any problems that may develop before the next visit here.   Rulon Eisenmenger, MD 06/19/17

## 2018-02-24 ENCOUNTER — Other Ambulatory Visit: Payer: Self-pay | Admitting: Hematology and Oncology

## 2018-02-24 DIAGNOSIS — Z853 Personal history of malignant neoplasm of breast: Secondary | ICD-10-CM

## 2018-03-26 ENCOUNTER — Ambulatory Visit
Admission: RE | Admit: 2018-03-26 | Discharge: 2018-03-26 | Disposition: A | Discharge status: Home / Self Care | Payer: Medicare HMO | Source: Ambulatory Visit | Attending: Hematology and Oncology | Admitting: Hematology and Oncology

## 2018-03-26 DIAGNOSIS — Z853 Personal history of malignant neoplasm of breast: Secondary | ICD-10-CM

## 2018-03-26 DIAGNOSIS — R928 Other abnormal and inconclusive findings on diagnostic imaging of breast: Secondary | ICD-10-CM | POA: Diagnosis not present

## 2018-03-29 ENCOUNTER — Other Ambulatory Visit: Payer: Self-pay | Admitting: Hematology and Oncology

## 2018-06-15 DIAGNOSIS — R69 Illness, unspecified: Secondary | ICD-10-CM | POA: Diagnosis not present

## 2018-06-21 ENCOUNTER — Inpatient Hospital Stay: Payer: Medicare HMO | Attending: Adult Health | Admitting: Adult Health

## 2018-06-21 ENCOUNTER — Encounter: Payer: Self-pay | Admitting: Adult Health

## 2018-06-21 ENCOUNTER — Telehealth: Payer: Self-pay | Admitting: Adult Health

## 2018-06-21 VITALS — BP 137/73 | HR 74 | Temp 98.8°F | Resp 18 | Ht 65.0 in | Wt 165.4 lb

## 2018-06-21 DIAGNOSIS — Z808 Family history of malignant neoplasm of other organs or systems: Secondary | ICD-10-CM | POA: Diagnosis not present

## 2018-06-21 DIAGNOSIS — E119 Type 2 diabetes mellitus without complications: Secondary | ICD-10-CM | POA: Diagnosis not present

## 2018-06-21 DIAGNOSIS — Z853 Personal history of malignant neoplasm of breast: Secondary | ICD-10-CM | POA: Insufficient documentation

## 2018-06-21 DIAGNOSIS — Z801 Family history of malignant neoplasm of trachea, bronchus and lung: Secondary | ICD-10-CM | POA: Insufficient documentation

## 2018-06-21 DIAGNOSIS — C50511 Malignant neoplasm of lower-outer quadrant of right female breast: Secondary | ICD-10-CM

## 2018-06-21 DIAGNOSIS — Z8049 Family history of malignant neoplasm of other genital organs: Secondary | ICD-10-CM | POA: Insufficient documentation

## 2018-06-21 DIAGNOSIS — Z803 Family history of malignant neoplasm of breast: Secondary | ICD-10-CM | POA: Diagnosis not present

## 2018-06-21 DIAGNOSIS — Z9221 Personal history of antineoplastic chemotherapy: Secondary | ICD-10-CM

## 2018-06-21 DIAGNOSIS — Z1239 Encounter for other screening for malignant neoplasm of breast: Secondary | ICD-10-CM

## 2018-06-21 DIAGNOSIS — Z923 Personal history of irradiation: Secondary | ICD-10-CM | POA: Insufficient documentation

## 2018-06-21 DIAGNOSIS — R011 Cardiac murmur, unspecified: Secondary | ICD-10-CM | POA: Diagnosis not present

## 2018-06-21 DIAGNOSIS — Z170 Estrogen receptor positive status [ER+]: Secondary | ICD-10-CM

## 2018-06-21 DIAGNOSIS — M810 Age-related osteoporosis without current pathological fracture: Secondary | ICD-10-CM

## 2018-06-21 NOTE — Progress Notes (Signed)
CLINIC:  Survivorship   REASON FOR VISIT:  Routine follow-up for history of breast cancer.   BRIEF ONCOLOGIC HISTORY:    Breast cancer of lower-outer quadrant of right female breast ([place])   05/08/2011 Surgery    Right breast lumpectomy: Invasive lobular carcinoma, grade 1, 2.2 cm, with LCIS, 1 sentinel node positive with focal extracapsular spread T2 N1 a M0 stage IIB. He had 97%, PR 0%, HER-2 negative ratio 0.85, Ki-67 5%, BUT score 26, 17% ROR      06/26/2011 - 08/28/2011 Chemotherapy    Taxotere and Cytoxan 4 cycles      09/16/2011 - 10/22/2011 Radiation Therapy    Adjuvant radiation therapy      11/04/2011 - 07/11/2016 Anti-estrogen oral therapy    Arimidex 1 mg daily (BCI Low risk)        INTERVAL HISTORY:  Grace Carroll presents to the [facility] today for routine follow-up for her history of breast cancer.  Overall, she reports feeling quite well. Her PCP is Dr. Heber [place].  She sees him regularly.  She has not had a skin cancer screening.  She is up to date with colon cancer screening.  Her mammogram results are listed below.  Last pap about 6-7 years ago.  Never smoker.    Grace Carroll walks three times per week about 2 miles per occasion.  Her breasts are doing well.  She notes she had a breast infection in January, and required antibiotics to treat it.     REVIEW OF SYSTEMS:  Review of Systems  Constitutional: Negative for appetite change, chills, fatigue, fever and unexpected weight change.  HENT:   Negative for hearing loss, lump/mass and trouble swallowing.   Eyes: Negative for eye problems and icterus.  Respiratory: Positive for cough (has chronic bronchitis). Negative for chest tightness and shortness of breath.   Cardiovascular: Negative for chest pain, leg swelling and palpitations.  Gastrointestinal: Negative for abdominal distention, abdominal pain, constipation, diarrhea, nausea and vomiting.  Endocrine: Negative for hot flashes.  Musculoskeletal: Negative for  arthralgias.  Skin: Negative for itching and rash.  Neurological: Positive for headaches (chronic and unchanged). Negative for dizziness, extremity weakness and numbness.  Hematological: Negative for adenopathy. Does not bruise/bleed easily.  Psychiatric/Behavioral: Negative for depression. The patient is not nervous/anxious.    Breast: Denies any new nodularity, masses, tenderness, nipple changes, or nipple discharge.       PAST MEDICAL/SURGICAL HISTORY:  Past Medical History:  Diagnosis Date  . Breast cancer ([place])    l breast lumpectomy- xrt 1992  . Cancer ([place]) right breast  . Diabetes mellitus    diet controlled diabetic  . Fatigue 11/04/2011  . Headache(784.0)    occasional migraines- takes tylenol  . Heart murmur    noted 2 years ago  . Personal history of chemotherapy   . Personal history of radiation therapy   . PONV (postoperative nausea and vomiting)   . Ulcer of the stomach and intestine    per h&p Dr Valere Dross 04/02/11   Past Surgical History:  Procedure Laterality Date  . APPENDECTOMY    . BREAST BIOPSY Right 03/25/2011  . BREAST LUMPECTOMY      left and right; right slnbx left 1992, right 2012  . CARPAL TUNNEL RELEASE     bilateral  . MASS EXCISION  05/07/2012   Procedure: EXCISION MASS;  Surgeon: Odis Hollingshead, MD;  Location: [facility];  Service: General;  Laterality: Right;  remove right breast mass  .  PORT-A-CATH REMOVAL  11/20/2011  . SHOULDER ARTHROSCOPY     bone spurs   . TUBAL LIGATION       ALLERGIES:  Allergies  Allergen Reactions  . Codeine Nausea And Vomiting    REACTION: nausea  . Zofran Anaphylaxis  . Gluten Meal Diarrhea     CURRENT MEDICATIONS:  Outpatient Encounter Medications as of 06/21/2018  Medication Sig  . alendronate (FOSAMAX) 70 MG tablet TAKE 1 TABLET BY MOUTH EVERY 7 DAYS WITH A FULL GLASS OF WATER ON AN EMPTY STOMACH  . budesonide-formoterol (SYMBICORT) 160-4.5 MCG/ACT inhaler Inhale 2 puffs into the  lungs as needed.   . Calcium Carbonate-Vitamin D (RA CALCIUM PLUS VITAMIN D) 600-200 MG-UNIT TABS Take by mouth daily.   . ranitidine (ZANTAC) 300 MG tablet Take 300 mg by mouth as needed.   . [DISCONTINUED] anastrozole (ARIMIDEX) 1 MG tablet TAKE 1 TABLET BY MOUTH EVERY DAY (Patient not taking: Reported on 06/21/2018)  . [DISCONTINUED] cholecalciferol (VITAMIN D) 400 UNITS TABS Take 4,000 Units by mouth daily.    No facility-administered encounter medications on file as of 06/21/2018.      ONCOLOGIC FAMILY HISTORY:  Family History  Problem Relation Age of Onset  . Cancer Mother        cervical  . Heart disease Mother   . Heart disease Father   . Cancer Sister        lung and brain  . Cancer Sister 50       breast  . Cancer Sister        colon  . Cancer Brother        brain    GENETIC COUNSELING/TESTING: recommended  SOCIAL HISTORY:  Social History   Socioeconomic History  . Marital status: Married    Spouse name: Not on file  . Number of children: Not on file  . Years of education: Not on file  . Highest education level: Not on file  Occupational History  . Not on file  Social Needs  . Financial resource strain: Not on file  . Food insecurity:    Worry: Not on file    Inability: Not on file  . Transportation needs:    Medical: Not on file    Non-medical: Not on file  Tobacco Use  . Smoking status: Never Smoker  . Smokeless tobacco: Never Used  Substance and Sexual Activity  . Alcohol use: No  . Drug use: No  . Sexual activity: Yes  Lifestyle  . Physical activity:    Days per week: Not on file    Minutes per session: Not on file  . Stress: Not on file  Relationships  . Social connections:    Talks on phone: Not on file    Gets together: Not on file    Attends religious service: Not on file    Active member of club or organization: Not on file    Attends meetings of clubs or organizations: Not on file    Relationship status: Not on file  . Intimate  partner violence:    Fear of current or ex partner: Not on file    Emotionally abused: Not on file    Physically abused: Not on file    Forced sexual activity: Not on file  Other Topics Concern  . Not on file  Social History Narrative  . Not on file      PHYSICAL EXAMINATION:  Vital Signs: Vitals:   06/21/18 0806  BP: 137/73  Pulse: 74  Resp: 18  Temp: 98.8 F (37.1 C)  SpO2: 100%   Filed Weights   06/21/18 0806  Weight: 165 lb 6.4 oz (75 kg)   General: Well-nourished, well-appearing female in no acute distress.  Unaccompanied today.   HEENT: Head is normocephalic.  Pupils equal and reactive to light. Conjunctivae clear without exudate.  Sclerae anicteric. Oral mucosa is pink, moist.  Oropharynx is pink without lesions or erythema.  Lymph: No cervical, supraclavicular, or infraclavicular lymphadenopathy noted on palpation.  Cardiovascular: Regular rate and rhythm.Marland Kitchen Respiratory: Clear to auscultation bilaterally. Chest expansion symmetric; breathing non-labored.  Breast Exam:  -Left breast: No appreciable masses on palpation. No skin redness, thickening, or peau d'orange appearance; no nipple retraction or nipple discharge; mild distortion in symmetry at previous lumpectomy site well healed scar without erythema or nodularity.  -Right breast: No appreciable masses on palpation. No skin redness, thickening, or peau d'orange appearance; no nipple retraction or nipple discharge; mild distortion in symmetry at previous lumpectomy site well healed scar without erythema or nodularity. -Axilla: No axillary adenopathy bilaterally.  GI: Abdomen soft and round; non-tender, non-distended. Bowel sounds normoactive. No hepatosplenomegaly.   GU: Deferred.  Neuro: No focal deficits. Steady gait.  Psych: Mood and affect normal and appropriate for situation.  MSK: No focal spinal tenderness to palpation, full range of motion in bilateral upper extremities Extremities: No edema. Skin: Warm  and dry.  LABORATORY DATA:  None for this visit   DIAGNOSTIC IMAGING:  Most recent mammogram:      ASSESSMENT AND PLAN:  Grace Carroll is a pleasant 67 y.o. female with history of Stage IIB right breast invasive ductal carcinoma, ER+/PR-/HER2-, diagnosed in 05/2011, treated with lumpectomy, adjuvant chemotherapy, adjuvant radiation therapy, and anti-estrogen therapy with Anastrozole x 5 years completed in 07/2016 (BCI low risk).  [facility] for surveillance and routine follow-up.   1. History of breast cancer:  Grace Carroll is currently clinically and radiographically without evidence of disease or recurrence of breast cancer. She will be due for mammogram in 03/2019; orders placed today.  She will return in one year for continued surveillance and monitoring with LTS follow up.  I encouraged her to call me with any questions or concerns before her next visit at the cancer center, and I would be happy to see her sooner, if needed.    2. Bone health:  Given Grace Carroll's age, history of breast cancer, and her current anti-estrogen therapy with Anastrozole, she is at risk for bone demineralization. Her last DEXA scan was in 2015 and indicated osteoporosis.  She has been taking Fosamax weekly and has not had repeat bone density test.  I recommended we repeat this today.  She is going to see if she can have one repeated with her PCP.  In the meantime, she was encouraged to increase her consumption of foods rich in calcium, as well as increase her weight-bearing activities.  She was given education on specific food and activities to promote bone health.  3. Cancer screening:  Due to Grace Carroll's history and her age, she should receive screening for skin cancers, colon cancer, and gynecologic cancers. She was encouraged to follow-up with her PCP for appropriate cancer screenings.   4. Health maintenance and wellness promotion: Grace Carroll was encouraged to consume 5-7 servings of fruits  and vegetables per day. She was also encouraged to engage in moderate to vigorous exercise for 30 minutes per day most days of the week. She was instructed  to limit her alcohol consumption and continue to abstain from tobacco use.      Dispo:  -Return to cancer center in one year for LTS follow up -Mammogram in 03/2019 -Referral to genetics -Bone density due   A total of (30) minutes of face-to-face time was spent with this patient with greater than 50% of that time in counseling and care-coordination.   Gardenia Phlegm, NP [facility] [phone]   Note: PRIMARY CARE PROVIDER Raelene Bott, [place] [phone]

## 2018-06-21 NOTE — Telephone Encounter (Signed)
Gave avs and calendar ° °

## 2018-06-21 NOTE — Patient Instructions (Signed)
Bone Health Bones protect organs, store calcium, and anchor muscles. Good health habits, such as eating nutritious foods and exercising regularly, are important for maintaining healthy bones. They can also help to prevent a condition that causes bones to lose density and become weak and brittle (osteoporosis). Why is bone mass important? Bone mass refers to the amount of bone tissue that you have. The higher your bone mass, the stronger your bones. An important step toward having healthy bones throughout life is to have strong and dense bones during childhood. A young adult who has a high bone mass is more likely to have a high bone mass later in life. Bone mass at its greatest it is called peak bone mass. A large decline in bone mass occurs in older adults. In women, it occurs about the time of menopause. During this time, it is important to practice good health habits, because if more bone is lost than what is replaced, the bones will become less healthy and more likely to break (fracture). If you find that you have a low bone mass, you may be able to prevent osteoporosis or further bone loss by changing your diet and lifestyle. How can I find out if my bone mass is low? Bone mass can be measured with an X-ray test that is called a bone mineral density (BMD) test. This test is recommended for all women who are age 65 or older. It may also be recommended for men who are age 70 or older, or for people who are more likely to develop osteoporosis due to:  Having bones that break easily.  Having a long-term disease that weakens bones, such as kidney disease or rheumatoid arthritis.  Having menopause earlier than normal.  Taking medicine that weakens bones, such as steroids, thyroid hormones, or hormone treatment for breast cancer or prostate cancer.  Smoking.  Drinking three or more alcoholic drinks each day.  What are the nutritional recommendations for healthy bones? To have healthy bones, you  need to get enough of the right minerals and vitamins. Most nutrition experts recommend getting these nutrients from the foods that you eat. Nutritional recommendations vary from person to person. Ask your health care provider what is healthy for you. Here are some general guidelines. Calcium Recommendations Calcium is the most important (essential) mineral for bone health. Most people can get enough calcium from their diet, but supplements may be recommended for people who are at risk for osteoporosis. Good sources of calcium include:  Dairy products, such as low-fat or nonfat milk, cheese, and yogurt.  Dark green leafy vegetables, such as bok choy and broccoli.  Calcium-fortified foods, such as orange juice, cereal, bread, soy beverages, and tofu products.  Nuts, such as almonds.  Follow these recommended amounts for daily calcium intake:  Children, age 1?3: 700 mg.  Children, age 4?8: 1,000 mg.  Children, age 9?13: 1,300 mg.  Teens, age 14?18: 1,300 mg.  Adults, age 19?50: 1,000 mg.  Adults, age 51?70: ? Men: 1,000 mg. ? Women: 1,200 mg.  Adults, age 71 or older: 1,200 mg.  Pregnant and breastfeeding females: ? Teens: 1,300 mg. ? Adults: 1,000 mg.  Vitamin D Recommendations Vitamin D is the most essential vitamin for bone health. It helps the body to absorb calcium. Sunlight stimulates the skin to make vitamin D, so be sure to get enough sunlight. If you live in a cold climate or you do not get outside often, your health care provider may recommend that you take vitamin   D supplements. Good sources of vitamin D in your diet include:  Egg yolks.  Saltwater fish.  Milk and cereal fortified with vitamin D.  Follow these recommended amounts for daily vitamin D intake:  Children and teens, age 1?18: 600 international units.  Adults, age 50 or younger: 400-800 international units.  Adults, age 51 or older: 800-1,000 international units.  Other Nutrients Other nutrients  for bone health include:  Phosphorus. This mineral is found in meat, poultry, dairy foods, nuts, and legumes. The recommended daily intake for adult men and adult women is 700 mg.  Magnesium. This mineral is found in seeds, nuts, dark green vegetables, and legumes. The recommended daily intake for adult men is 400?420 mg. For adult women, it is 310?320 mg.  Vitamin K. This vitamin is found in green leafy vegetables. The recommended daily intake is 120 mg for adult men and 90 mg for adult women.  What type of physical activity is best for building and maintaining healthy bones? Weight-bearing and strength-building activities are important for building and maintaining peak bone mass. Weight-bearing activities cause muscles and bones to work against gravity. Strength-building activities increases muscle strength that supports bones. Weight-bearing and muscle-building activities include:  Walking and hiking.  Jogging and running.  Dancing.  Gym exercises.  Lifting weights.  Tennis and racquetball.  Climbing stairs.  Aerobics.  Adults should get at least 30 minutes of moderate physical activity on most days. Children should get at least 60 minutes of moderate physical activity on most days. Ask your health care provide what type of exercise is best for you. Where can I find more information? For more information, check out the following websites:  National Osteoporosis Foundation: http://nof.org/learn/basics  National Institutes of Health: http://www.niams.nih.gov/Health_Info/Bone/Bone_Health/bone_health_for_life.asp  This information is not intended to replace advice given to you by your health care provider. Make sure you discuss any questions you have with your health care provider. Document Released: 02/07/2004 Document Revised: 06/06/2016 Document Reviewed: 11/22/2014 Elsevier Interactive Patient Education  2018 [employer]  

## 2018-07-07 DIAGNOSIS — R5383 Other fatigue: Secondary | ICD-10-CM | POA: Diagnosis not present

## 2018-07-07 DIAGNOSIS — J4521 Mild intermittent asthma with (acute) exacerbation: Secondary | ICD-10-CM | POA: Diagnosis not present

## 2018-07-07 DIAGNOSIS — C50411 Malignant neoplasm of upper-outer quadrant of right female breast: Secondary | ICD-10-CM | POA: Diagnosis not present

## 2018-07-07 DIAGNOSIS — R7303 Prediabetes: Secondary | ICD-10-CM | POA: Diagnosis not present

## 2018-07-07 DIAGNOSIS — M810 Age-related osteoporosis without current pathological fracture: Secondary | ICD-10-CM | POA: Diagnosis not present

## 2018-07-07 DIAGNOSIS — Z170 Estrogen receptor positive status [ER+]: Secondary | ICD-10-CM | POA: Diagnosis not present

## 2018-07-07 DIAGNOSIS — E538 Deficiency of other specified B group vitamins: Secondary | ICD-10-CM | POA: Diagnosis not present

## 2018-07-07 DIAGNOSIS — Z136 Encounter for screening for cardiovascular disorders: Secondary | ICD-10-CM | POA: Diagnosis not present

## 2018-07-07 DIAGNOSIS — E559 Vitamin D deficiency, unspecified: Secondary | ICD-10-CM | POA: Diagnosis not present

## 2018-07-07 DIAGNOSIS — Z23 Encounter for immunization: Secondary | ICD-10-CM | POA: Diagnosis not present

## 2018-07-20 ENCOUNTER — Inpatient Hospital Stay: Payer: Medicare HMO | Admitting: Genetics

## 2018-07-20 ENCOUNTER — Telehealth: Payer: Self-pay

## 2018-07-20 NOTE — Telephone Encounter (Signed)
Per 8/19 voice mail return. Called and left a message for the patient with a new appointment date. Will also mail a letter with a calender enclosed.

## 2018-07-22 DIAGNOSIS — M810 Age-related osteoporosis without current pathological fracture: Secondary | ICD-10-CM | POA: Diagnosis not present

## 2018-07-22 DIAGNOSIS — Z79811 Long term (current) use of aromatase inhibitors: Secondary | ICD-10-CM | POA: Diagnosis not present

## 2018-07-22 DIAGNOSIS — Z780 Asymptomatic menopausal state: Secondary | ICD-10-CM | POA: Diagnosis not present

## 2018-07-22 DIAGNOSIS — Z7983 Long term (current) use of bisphosphonates: Secondary | ICD-10-CM | POA: Diagnosis not present

## 2018-08-16 ENCOUNTER — Inpatient Hospital Stay: Payer: Medicare HMO

## 2018-08-16 ENCOUNTER — Inpatient Hospital Stay: Payer: Medicare HMO | Admitting: Genetics

## 2018-11-09 DIAGNOSIS — H5213 Myopia, bilateral: Secondary | ICD-10-CM | POA: Diagnosis not present

## 2019-03-16 ENCOUNTER — Other Ambulatory Visit: Payer: Self-pay | Admitting: Hematology and Oncology

## 2019-05-18 ENCOUNTER — Ambulatory Visit: Payer: Medicare HMO

## 2019-06-21 ENCOUNTER — Other Ambulatory Visit: Payer: Self-pay

## 2019-06-21 ENCOUNTER — Ambulatory Visit
Admission: RE | Admit: 2019-06-21 | Discharge: 2019-06-21 | Disposition: A | Discharge status: Home / Self Care | Payer: Medicare HMO | Source: Ambulatory Visit | Attending: Adult Health | Admitting: Adult Health

## 2019-06-21 DIAGNOSIS — Z1239 Encounter for other screening for malignant neoplasm of breast: Secondary | ICD-10-CM

## 2019-06-27 ENCOUNTER — Inpatient Hospital Stay: Payer: Medicare HMO | Attending: Adult Health | Admitting: Adult Health

## 2019-06-27 ENCOUNTER — Telehealth: Payer: Self-pay | Admitting: Adult Health

## 2019-06-27 ENCOUNTER — Encounter: Payer: Self-pay | Admitting: Adult Health

## 2019-06-27 ENCOUNTER — Other Ambulatory Visit: Payer: Self-pay

## 2019-06-27 VITALS — BP 132/63 | HR 85 | Temp 98.0°F | Resp 16 | Ht 68.0 in | Wt 170.1 lb

## 2019-06-27 DIAGNOSIS — Z79811 Long term (current) use of aromatase inhibitors: Secondary | ICD-10-CM | POA: Insufficient documentation

## 2019-06-27 DIAGNOSIS — E119 Type 2 diabetes mellitus without complications: Secondary | ICD-10-CM | POA: Diagnosis not present

## 2019-06-27 DIAGNOSIS — Z9221 Personal history of antineoplastic chemotherapy: Secondary | ICD-10-CM

## 2019-06-27 DIAGNOSIS — Z923 Personal history of irradiation: Secondary | ICD-10-CM

## 2019-06-27 DIAGNOSIS — Z800 Family history of malignant neoplasm of digestive organs: Secondary | ICD-10-CM | POA: Diagnosis not present

## 2019-06-27 DIAGNOSIS — R011 Cardiac murmur, unspecified: Secondary | ICD-10-CM

## 2019-06-27 DIAGNOSIS — C50511 Malignant neoplasm of lower-outer quadrant of right female breast: Secondary | ICD-10-CM | POA: Insufficient documentation

## 2019-06-27 DIAGNOSIS — Z79899 Other long term (current) drug therapy: Secondary | ICD-10-CM | POA: Diagnosis not present

## 2019-06-27 DIAGNOSIS — Z803 Family history of malignant neoplasm of breast: Secondary | ICD-10-CM | POA: Diagnosis not present

## 2019-06-27 DIAGNOSIS — Z170 Estrogen receptor positive status [ER+]: Secondary | ICD-10-CM

## 2019-06-27 DIAGNOSIS — Z808 Family history of malignant neoplasm of other organs or systems: Secondary | ICD-10-CM

## 2019-06-27 NOTE — Progress Notes (Signed)
CLINIC:  Survivorship   REASON FOR VISIT:  Routine follow-up for history of breast cancer.   BRIEF ONCOLOGIC HISTORY:  Oncology History  Breast cancer of lower-outer quadrant of right female breast ([place])  05/08/2011 Surgery   Right breast lumpectomy: Invasive lobular carcinoma, grade 1, 2.2 cm, with LCIS, 1 sentinel node positive with focal extracapsular spread T2 N1 a M0 stage IIB. ER 97%, PR 0%, HER-2 negative ratio 0.85, Ki-67 5%, BUT score 26, 17% ROR   06/26/2011 - 08/28/2011 Chemotherapy   Taxotere and Cytoxan 4 cycles   09/16/2011 - 10/22/2011 Radiation Therapy   Adjuvant radiation therapy   11/04/2011 - 07/11/2016 Anti-estrogen oral therapy   Arimidex 1 mg daily (BCI Low risk)      INTERVAL HISTORY:  Grace Carroll presents to the [facility] today for routine follow-up for her history of breast cancer.  Overall, she reports feeling quite well. Her PCP is Dr. Heber [place].  She is up to date with her cancer screenings.  Grace Carroll is not exercising.  She understands that she needs to.  She is not following any particular diet.    Since her last visit, she underwent bilateral breast screening mammogram on 06/21/2019 with TOMO and CAD.  It showed no evidence of malignancy and repeat mammogram was recommended in one year.  She had breast density category B.    At her last visit I recommended she consider talking to genetics about her family history and whether or not she would be eligible for genetic testing. We also discussed having a bone density testing repeated for her osteoporosis treated with Fosamax by her PCP.  She says she underwent bone density testing at [facility]. 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  She was recommended to continue on Fosamax and is tolerating that well. She didn't undergo genetic testing because she couldn't get anyone to explain to her what the out of pocket cost was.    REVIEW OF SYSTEMS:  Review of Systems  Constitutional: Negative for  appetite change, chills, fatigue, fever and unexpected weight change.  HENT:   Negative for hearing loss, lump/mass, sore throat and trouble swallowing.   Eyes: Negative for icterus.  Respiratory: Negative for chest tightness, cough and shortness of breath.   Cardiovascular: Negative for chest pain, leg swelling and palpitations.  Gastrointestinal: Negative for abdominal distention, abdominal pain, constipation, diarrhea, nausea and vomiting.  Endocrine: Negative for hot flashes.  Genitourinary: Negative for difficulty urinating.   Musculoskeletal: Negative for arthralgias.  Skin: Negative for itching and rash.  Neurological: Negative for dizziness, extremity weakness, headaches and numbness.  Hematological: Negative for adenopathy. Does not bruise/bleed easily.  Psychiatric/Behavioral: Negative for depression. The patient is not nervous/anxious.   Breast: Denies any new nodularity, masses, tenderness, nipple changes, or nipple discharge.       PAST MEDICAL/SURGICAL HISTORY:  Past Medical History:  Diagnosis Date  . Breast cancer ([place])    l breast lumpectomy- xrt 1992  . Cancer ([place]) right breast  . Diabetes mellitus    diet controlled diabetic  . Fatigue 11/04/2011  . Headache(784.0)    occasional migraines- takes tylenol  . Heart murmur    noted 2 years ago  . Personal history of chemotherapy   . Personal history of radiation therapy   . PONV (postoperative nausea and vomiting)   . Ulcer of the stomach and intestine    per h&p Dr Valere Dross 04/02/11   Past Surgical History:  Procedure Laterality Date  .  APPENDECTOMY    . BREAST BIOPSY Right 03/25/2011  . BREAST LUMPECTOMY      left and right; right slnbx left 1992, right 2012  . CARPAL TUNNEL RELEASE     bilateral  . MASS EXCISION  05/07/2012   Procedure: EXCISION MASS;  Surgeon: Odis Hollingshead, MD;  Location: [place];  Service: General;  Laterality: Right;  remove right breast mass  . PORT-A-CATH REMOVAL   11/20/2011  . SHOULDER ARTHROSCOPY     bone spurs   . TUBAL LIGATION       ALLERGIES:  Allergies  Allergen Reactions  . Codeine Nausea And Vomiting    REACTION: nausea  . Zofran Anaphylaxis  . Gluten Meal Diarrhea     CURRENT MEDICATIONS:  Outpatient Encounter Medications as of 06/27/2019  Medication Sig  . alendronate (FOSAMAX) 70 MG tablet TAKE 1 TABLET BY MOUTH EVERY 7 DAYS WITH A FULL GLASS OF WATER ON AN EMPTY STOMACH  . budesonide-formoterol (SYMBICORT) 160-4.5 MCG/ACT inhaler Inhale 2 puffs into the lungs as needed.   . Calcium Carbonate-Vitamin D (RA CALCIUM PLUS VITAMIN D) 600-200 MG-UNIT TABS Take by mouth daily.   . pantoprazole (PROTONIX) 40 MG tablet Take 40 mg by mouth daily.  . sucralfate (CARAFATE) 1 g tablet Take 1 g by mouth 4 (four) times daily.  . [DISCONTINUED] ranitidine (ZANTAC) 300 MG tablet Take 300 mg by mouth as needed.    No facility-administered encounter medications on file as of 06/27/2019.      ONCOLOGIC FAMILY HISTORY:  Family History  Problem Relation Age of Onset  . Cancer Mother        cervical  . Heart disease Mother   . Heart disease Father   . Cancer Sister        lung and brain  . Cancer Sister 42       breast  . Cancer Sister        colon  . Cancer Brother        brain    GENETIC COUNSELING/TESTING: Recommended; patient declined due to ambiguity over cost  SOCIAL HISTORY:  Social History   Socioeconomic History  . Marital status: Married    Spouse name: Not on file  . Number of children: Not on file  . Years of education: Not on file  . Highest education level: Not on file  Occupational History  . Not on file  Social Needs  . Financial resource strain: Not on file  . Food insecurity    Worry: Not on file    Inability: Not on file  . Transportation needs    Medical: Not on file    Non-medical: Not on file  Tobacco Use  . Smoking status: Never Smoker  . Smokeless tobacco: Never Used  Substance and Sexual  Activity  . Alcohol use: No  . Drug use: No  . Sexual activity: Yes  Lifestyle  . Physical activity    Days per week: Not on file    Minutes per session: Not on file  . Stress: Not on file  Relationships  . Social Herbalist on phone: Not on file    Gets together: Not on file    Attends religious service: Not on file    Active member of club or organization: Not on file    Attends meetings of clubs or organizations: Not on file    Relationship status: Not on file  . Intimate partner violence  Fear of current or ex partner: Not on file    Emotionally abused: Not on file    Physically abused: Not on file    Forced sexual activity: Not on file  Other Topics Concern  . Not on file  Social History Narrative  . Not on file      PHYSICAL EXAMINATION:  Vital Signs: Vitals:   06/27/19 1110  BP: 132/63  Pulse: 85  Resp: 16  Temp: 98 F (36.7 C)  SpO2: 97%   Filed Weights   06/27/19 1110  Weight: 170 lb 2 oz (77.2 kg)   General: Well-nourished, well-appearing female in no acute distress.  Unaccompanied today.   HEENT: Head is normocephalic.  Pupils equal and reactive to light. Conjunctivae clear without exudate.  Sclerae anicteric. Oral mucosa is pink, moist.  Oropharynx is pink without lesions or erythema.  Lymph: No cervical, supraclavicular, or infraclavicular lymphadenopathy noted on palpation.  Cardiovascular: Regular rate and rhythm.Marland Kitchen Respiratory: Clear to auscultation bilaterally. Chest expansion symmetric; breathing non-labored.  Breast Exam:  -Left breast: No appreciable masses on palpation. No skin redness, thickening, or peau d'orange appearance; no nipple retraction or nipple discharge; mild distortion in symmetry at previous lumpectomy site well healed scar without erythema or nodularity.  -Right breast: No appreciable masses on palpation. No skin redness, thickening, or peau d'orange appearance; no nipple retraction or nipple discharge; mild  distortion in symmetry at previous lumpectomy site well healed scar without erythema or nodularity. -Axilla: No axillary adenopathy bilaterally.  GI: Abdomen soft and round; non-tender, non-distended. Bowel sounds normoactive. No hepatosplenomegaly.   GU: Deferred.  Neuro: No focal deficits. Steady gait.  Psych: Mood and affect normal and appropriate for situation.  MSK: No focal spinal tenderness to palpation, full range of motion in bilateral upper extremities Extremities: No edema. Skin: Warm and dry.  LABORATORY DATA:  None for this visit   DIAGNOSTIC IMAGING:  Most recent mammogram:      ASSESSMENT AND PLAN:  Ms.. Carroll is a pleasant 68 y.o. female with history of Stage IIB right breast invasive ductal carcinoma, ER+/PR-/HER2-, diagnosed in 05/2011, treated with lumpectomy, adjuvant chemotherapy, adjuvant radiation therapy, and anti-estrogen therapy with Anastrozole x 5 years completed in 07/2016 (BCI low risk).  [facility] for [facility] and routine follow-up.   1. History of breast cancer:  Grace Carroll is currently clinically and radiographically without evidence of disease or recurrence of breast cancer. She will be due for mammogram in 05/2020; orders placed today.  She does not want to discuss genetic testing, and has decided against it after our discussion. She will return in one year for continued surveillance and monitoring with LTS follow up.  I encouraged her to call me with any questions or concerns before her next visit at the cancer center, and I would be happy to see her sooner, if needed.    2. Bone health:  Given Grace Carroll's age, history of breast cancer, and her current anti-estrogen therapy with Anastrozole, she is at risk for bone demineralization.  She underwent repeat bone density testing.  I do not have those results.  She says she still has ostoeporosis and still is taking fosamax. She notes she will try to get the results from [facility]. She was given education on specific food and activities to promote bone health.  3. Cancer screening:  Due to Grace Carroll's history and her age, she should receive screening for skin cancers, colon cancer, and gynecologic cancers. She  was encouraged to follow-up with her PCP for appropriate cancer screenings.   4. Health maintenance and wellness promotion: Grace Carroll was encouraged to consume 5-7 servings of fruits and vegetables per day. She was also encouraged to engage in moderate to vigorous exercise for 30 minutes per day most days of the week. She was instructed to limit her alcohol consumption and continue to abstain from tobacco use.      Dispo:  -Return to cancer center in one year for LTS follow up -Mammogram in 05/2020    A total of (20) minutes of face-to-face time was spent with this patient with greater than 50% of that time in counseling and care-coordination.   Gardenia Phlegm, NP Survivorship Program [facility] [phone]   Note: PRIMARY CARE PROVIDER Raelene Bott, [place] [phone]

## 2019-06-27 NOTE — Telephone Encounter (Signed)
Gave avs and calendar ° °

## 2020-03-05 ENCOUNTER — Other Ambulatory Visit: Payer: Self-pay | Admitting: Hematology and Oncology

## 2020-05-10 ENCOUNTER — Other Ambulatory Visit: Payer: Self-pay | Admitting: Hematology and Oncology

## 2020-05-10 DIAGNOSIS — Z1231 Encounter for screening mammogram for malignant neoplasm of breast: Secondary | ICD-10-CM

## 2020-06-21 ENCOUNTER — Ambulatory Visit: Payer: Medicare HMO

## 2020-06-24 NOTE — Progress Notes (Signed)
CLINIC:  Survivorship   REASON FOR VISIT:  Routine follow-up for history of breast cancer.   BRIEF ONCOLOGIC HISTORY:  Oncology History  Breast cancer of lower-outer quadrant of right female breast ([place])  05/08/2011 Surgery   Right breast lumpectomy: Invasive lobular carcinoma, grade 1, 2.2 cm, with LCIS, 1 sentinel node positive with focal extracapsular spread T2 N1 a M0 stage IIB. ER 97%, PR 0%, HER-2 negative ratio 0.85, Ki-67 5%, BUT score 26, 17% ROR   06/26/2011 - 08/28/2011 Chemotherapy   Taxotere and Cytoxan 4 cycles   09/16/2011 - 10/22/2011 Radiation Therapy   Adjuvant radiation therapy   11/04/2011 - 07/11/2016 Anti-estrogen oral therapy   Arimidex 1 mg daily (BCI Low risk)      INTERVAL HISTORY:  Grace Carroll presents to the [facility] today for routine follow-up for her history of breast cancer.    She underwent her most recent mammogram today at the breast center and results are pending.  She denies any breast changes or concerns.    She notes she is eating plenty of fruits and vegetables and notes she isn't exercising as much as she could be.  It has been a little over a year since she has undergone annual exam with her PCP.  She has not had any health issues to pop up in that time period and notes that she is up to date with colon cancer screening and slightly overdue with skin cancer screening.    She is feeling quite well today.  Her most recent bone density was completed in 2015 and consistent with osteoporosis.  She is taking Vitamin D.      REVIEW OF SYSTEMS:  Review of Systems  Constitutional: Negative for appetite change, chills, fatigue, fever and unexpected weight change.  HENT:   Negative for hearing loss, lump/mass, sore throat and trouble swallowing.   Eyes: Negative for eye problems and icterus.  Respiratory: Negative for chest tightness, cough and shortness of breath.   Cardiovascular: Negative for chest pain, leg swelling and palpitations.    Gastrointestinal: Negative for abdominal distention, abdominal pain, constipation, diarrhea, nausea and vomiting.  Endocrine: Negative for hot flashes.  Genitourinary: Negative for difficulty urinating.   Musculoskeletal: Positive for back pain (chronic). Negative for arthralgias.  Skin: Negative for itching and rash.  Neurological: Negative for dizziness, extremity weakness, headaches and numbness.  Hematological: Negative for adenopathy. Does not bruise/bleed easily.  Psychiatric/Behavioral: Negative for depression. The patient is not nervous/anxious.     Breast: Denies any new nodularity, masses, tenderness, nipple changes, or nipple discharge.       PAST MEDICAL/SURGICAL HISTORY:  Past Medical History:  Diagnosis Date  . Breast cancer ([place])    l breast lumpectomy- xrt 1992  . Cancer ([place]) right breast  . Diabetes mellitus    diet controlled diabetic  . Fatigue 11/04/2011  . Headache(784.0)    occasional migraines- takes tylenol  . Heart murmur    noted 2 years ago  . Personal history of chemotherapy   . Personal history of radiation therapy   . PONV (postoperative nausea and vomiting)   . Ulcer of the stomach and intestine    per h&p Dr Valere Dross 04/02/11   Past Surgical History:  Procedure Laterality Date  . APPENDECTOMY    . BREAST BIOPSY Right 03/25/2011  . BREAST LUMPECTOMY Left 1992  . BREAST LUMPECTOMY Right 2012  . CARPAL TUNNEL RELEASE     bilateral  . MASS EXCISION  05/07/2012   Procedure:  EXCISION MASS;  Surgeon: Odis Hollingshead, MD;  Location: [facility];  Service: General;  Laterality: Right;  remove right breast mass  . PORT-A-CATH REMOVAL  11/20/2011  . SHOULDER ARTHROSCOPY     bone spurs   . TUBAL LIGATION       ALLERGIES:  Allergies  Allergen Reactions  . Codeine Nausea And Vomiting    REACTION: nausea  . Zofran Anaphylaxis  . Gluten Meal Diarrhea     CURRENT MEDICATIONS:  Outpatient Encounter Medications as of 06/26/2020   Medication Sig  . alendronate (FOSAMAX) 70 MG tablet TAKE 1 TABLET BY MOUTH EVERY 7 DAYS WITH A FULL GLASS OF WATER ON AN EMPTY STOMACH  . budesonide-formoterol (SYMBICORT) 160-4.5 MCG/ACT inhaler Inhale 2 puffs into the lungs as needed.   . Calcium Carbonate-Vitamin D (RA CALCIUM PLUS VITAMIN D) 600-200 MG-UNIT TABS Take by mouth daily.   . pantoprazole (PROTONIX) 40 MG tablet Take 40 mg by mouth daily.  . sucralfate (CARAFATE) 1 g tablet Take 1 g by mouth 4 (four) times daily.   No facility-administered encounter medications on file as of 06/26/2020.     ONCOLOGIC FAMILY HISTORY:  Family History  Problem Relation Age of Onset  . Cancer Mother        cervical  . Heart disease Mother   . Heart disease Father   . Cancer Sister        lung and brain  . Cancer Sister 24       breast  . Breast cancer Sister   . Cancer Sister        colon  . Cancer Brother        brain    GENETIC COUNSELING/TESTING: Recommended; patient declined due to ambiguity over cost  SOCIAL HISTORY:  Social History   Socioeconomic History  . Marital status: Married    Spouse name: Not on file  . Number of children: Not on file  . Years of education: Not on file  . Highest education level: Not on file  Occupational History  . Not on file  Tobacco Use  . Smoking status: Never Smoker  . Smokeless tobacco: Never Used  Substance and Sexual Activity  . Alcohol use: No  . Drug use: No  . Sexual activity: Yes  Other Topics Concern  . Not on file  Social History Narrative  . Not on file   Social Determinants of Health   Financial Resource Strain:   . Difficulty of Paying Living Expenses:   Food Insecurity:   . Worried About Charity fundraiser in the Last Year:   . Arboriculturist in the Last Year:   Transportation Needs:   . Film/video editor (Medical):   Marland Kitchen Lack of Transportation (Non-Medical):   Physical Activity:   . Days of Exercise per Week:   . Minutes of Exercise per Session:    Stress:   . Feeling of Stress :   Social Connections:   . Frequency of Communication with Friends and Family:   . Frequency of Social Gatherings with Friends and Family:   . Attends Religious Services:   . Active Member of Clubs or Organizations:   . Attends Archivist Meetings:   Marland Kitchen Marital Status:   Intimate Partner Violence:   . Fear of Current or Ex-Partner:   . Emotionally Abused:   Marland Kitchen Physically Abused:   . Sexually Abused:       PHYSICAL EXAMINATION:  Vital Signs: Vitals:  06/26/20 1053  BP: (!) 134/72  Pulse: 87  Resp: 17  Temp: 98.2 F (36.8 C)  SpO2: 99%   Filed Weights   06/26/20 1053  Weight: 169 lb 6.4 oz (76.8 kg)   General: Well-nourished, well-appearing female in no acute distress.  Unaccompanied today.   HEENT: Head is normocephalic.  Pupils equal and reactive to light. Conjunctivae clear without exudate.  Sclerae anicteric. Oral mucosa is pink, moist.  Oropharynx is pink without lesions or erythema.  Lymph: No cervical, supraclavicular, or infraclavicular lymphadenopathy noted on palpation.  Cardiovascular: Regular rate and rhythm.Marland Kitchen Respiratory: Clear to auscultation bilaterally. Chest expansion symmetric; breathing non-labored.  Breast Exam:  -Left breast: No appreciable masses on palpation. No skin redness, thickening, or peau d'orange appearance; no nipple retraction or nipple discharge; mild distortion in symmetry at previous lumpectomy site well healed scar without erythema or nodularity.  -Right breast: No appreciable masses on palpation. No skin redness, thickening, or peau d'orange appearance; no nipple retraction or nipple discharge; mild distortion in symmetry at previous lumpectomy site well healed scar without erythema or nodularity. -Axilla: No axillary adenopathy bilaterally.  GI: Abdomen soft and round; non-tender, non-distended. Bowel sounds normoactive. No hepatosplenomegaly.   GU: Deferred.  Neuro: No focal deficits. Steady  gait.  Psych: Mood and affect normal and appropriate for situation.  MSK: No focal spinal tenderness to palpation, full range of motion in bilateral upper extremities Extremities: No edema. Skin: Warm and dry.  LABORATORY DATA:  None for this visit   DIAGNOSTIC IMAGING:  Most recent mammogram:  Completed today, awaiting reuslts    ASSESSMENT AND PLAN:  Ms.. Carroll is a pleasant 69 y.o. female with history of Stage IIB right breast invasive ductal carcinoma, ER+/PR-/HER2-, diagnosed in 05/2011, treated with lumpectomy, adjuvant chemotherapy, adjuvant radiation therapy, and anti-estrogen therapy with Anastrozole x 5 years completed in 07/2016 (BCI low risk).  [facility] for surveillance and routine follow-up.   1. History of breast cancer:  Grace Carroll is currently clinically and radiographically without evidence of disease or recurrence of breast cancer.  She will return in one year for continued surveillance and monitoring with LTS follow up.  I encouraged her to call me with any questions or concerns before her next visit at the cancer center, and I would be happy to see her sooner, if needed.    2. Bone health:  Given Grace Carroll's age, history of breast cancer, and her previous anti-estrogen therapy with Anastrozole, she is at risk for bone demineralization.  Her most recent bone density was performed in 2015 and consistent with osteoporosis.  I have placed orders for a repeat test to be completed.  She was given education on specific food and activities to promote bone health.  3. Cancer screening:  Due to Grace Carroll's history and her age, she should receive screening for skin cancers, colon cancer, and gynecologic cancers. She was encouraged to follow-up with her PCP for appropriate cancer screenings.   4. Health maintenance and wellness promotion: Grace Carroll was encouraged to consume 5-7 servings of fruits and vegetables per day. She was also encouraged to engage in  moderate to vigorous exercise for 30 minutes per day most days of the week. She was instructed to limit her alcohol consumption and continue to abstain from tobacco use.      Dispo:  -Return to cancer center in one year for LTS follow up -Mammogram in 05/2021 -Bone density due    Total encounter time:  20 minutesWilber Bihari, NP 06/26/20 11:23 AM Medical Oncology and Hematology [facility] [place], [place] [address] Tel. [phone]    Fax. [phone]  *Total Encounter Time as defined by the Centers for Medicare and Medicaid Services includes, in addition to the face-to-face time of a patient visit (documented in the note above) non-face-to-face time: obtaining and reviewing outside history, ordering and reviewing medications, tests or procedures, care coordination (communications with other health care professionals or caregivers) and documentation in the medical record.    Note: PRIMARY CARE PROVIDER Raelene Bott, [place] [phone]

## 2020-06-26 ENCOUNTER — Ambulatory Visit
Admission: RE | Admit: 2020-06-26 | Discharge: 2020-06-26 | Disposition: A | Discharge status: Home / Self Care | Payer: Medicare HMO | Source: Ambulatory Visit | Attending: Hematology and Oncology | Admitting: Hematology and Oncology

## 2020-06-26 ENCOUNTER — Telehealth: Payer: Self-pay | Admitting: Adult Health

## 2020-06-26 ENCOUNTER — Other Ambulatory Visit: Payer: Self-pay

## 2020-06-26 ENCOUNTER — Encounter: Payer: Self-pay | Admitting: Adult Health

## 2020-06-26 ENCOUNTER — Inpatient Hospital Stay: Payer: Medicare HMO | Attending: Adult Health | Admitting: Adult Health

## 2020-06-26 VITALS — BP 134/72 | HR 87 | Temp 98.2°F | Resp 17 | Ht 68.0 in | Wt 169.4 lb

## 2020-06-26 DIAGNOSIS — Z79811 Long term (current) use of aromatase inhibitors: Secondary | ICD-10-CM | POA: Insufficient documentation

## 2020-06-26 DIAGNOSIS — Z170 Estrogen receptor positive status [ER+]: Secondary | ICD-10-CM | POA: Diagnosis not present

## 2020-06-26 DIAGNOSIS — C50511 Malignant neoplasm of lower-outer quadrant of right female breast: Secondary | ICD-10-CM | POA: Diagnosis present

## 2020-06-26 DIAGNOSIS — Z800 Family history of malignant neoplasm of digestive organs: Secondary | ICD-10-CM | POA: Diagnosis not present

## 2020-06-26 DIAGNOSIS — E119 Type 2 diabetes mellitus without complications: Secondary | ICD-10-CM | POA: Insufficient documentation

## 2020-06-26 DIAGNOSIS — Z801 Family history of malignant neoplasm of trachea, bronchus and lung: Secondary | ICD-10-CM | POA: Insufficient documentation

## 2020-06-26 DIAGNOSIS — Z1231 Encounter for screening mammogram for malignant neoplasm of breast: Secondary | ICD-10-CM

## 2020-06-26 DIAGNOSIS — Z923 Personal history of irradiation: Secondary | ICD-10-CM | POA: Insufficient documentation

## 2020-06-26 DIAGNOSIS — Z803 Family history of malignant neoplasm of breast: Secondary | ICD-10-CM | POA: Insufficient documentation

## 2020-06-26 DIAGNOSIS — Z7951 Long term (current) use of inhaled steroids: Secondary | ICD-10-CM | POA: Insufficient documentation

## 2020-06-26 DIAGNOSIS — Z9221 Personal history of antineoplastic chemotherapy: Secondary | ICD-10-CM | POA: Diagnosis not present

## 2020-06-26 NOTE — Patient Instructions (Signed)

## 2020-06-26 NOTE — Telephone Encounter (Signed)
Scheduled appts per 7/27 los. Gave pt a print out of appt calendar.

## 2020-10-19 ENCOUNTER — Ambulatory Visit
Admission: RE | Admit: 2020-10-19 | Discharge: 2020-10-19 | Disposition: A | Discharge status: Home / Self Care | Payer: Medicare HMO | Source: Ambulatory Visit | Attending: Adult Health | Admitting: Adult Health

## 2020-10-19 ENCOUNTER — Other Ambulatory Visit: Payer: Self-pay

## 2020-10-19 DIAGNOSIS — Z170 Estrogen receptor positive status [ER+]: Secondary | ICD-10-CM

## 2020-10-19 DIAGNOSIS — C50511 Malignant neoplasm of lower-outer quadrant of right female breast: Secondary | ICD-10-CM

## 2021-03-01 ENCOUNTER — Other Ambulatory Visit: Payer: Self-pay | Admitting: Hematology and Oncology

## 2021-05-15 ENCOUNTER — Other Ambulatory Visit: Payer: Self-pay | Admitting: Internal Medicine

## 2021-05-15 DIAGNOSIS — Z1231 Encounter for screening mammogram for malignant neoplasm of breast: Secondary | ICD-10-CM

## 2021-06-18 ENCOUNTER — Telehealth: Payer: Self-pay | Admitting: Adult Health

## 2021-06-18 NOTE — Telephone Encounter (Signed)
Scheduled appointment per provider. Patient is aware. 

## 2021-07-01 ENCOUNTER — Encounter: Payer: Self-pay | Admitting: Adult Health

## 2021-07-01 ENCOUNTER — Other Ambulatory Visit: Payer: Self-pay

## 2021-07-01 ENCOUNTER — Inpatient Hospital Stay: Payer: Medicare HMO | Attending: Adult Health | Admitting: Adult Health

## 2021-07-01 VITALS — BP 129/68 | HR 81 | Temp 97.5°F | Resp 18 | Wt 163.1 lb

## 2021-07-01 DIAGNOSIS — R7303 Prediabetes: Secondary | ICD-10-CM | POA: Insufficient documentation

## 2021-07-01 DIAGNOSIS — Z923 Personal history of irradiation: Secondary | ICD-10-CM | POA: Insufficient documentation

## 2021-07-01 DIAGNOSIS — Z79811 Long term (current) use of aromatase inhibitors: Secondary | ICD-10-CM | POA: Diagnosis not present

## 2021-07-01 DIAGNOSIS — Z170 Estrogen receptor positive status [ER+]: Secondary | ICD-10-CM | POA: Diagnosis not present

## 2021-07-01 DIAGNOSIS — Z8711 Personal history of peptic ulcer disease: Secondary | ICD-10-CM | POA: Diagnosis not present

## 2021-07-01 DIAGNOSIS — Z9221 Personal history of antineoplastic chemotherapy: Secondary | ICD-10-CM | POA: Diagnosis not present

## 2021-07-01 DIAGNOSIS — M810 Age-related osteoporosis without current pathological fracture: Secondary | ICD-10-CM | POA: Insufficient documentation

## 2021-07-01 DIAGNOSIS — C50511 Malignant neoplasm of lower-outer quadrant of right female breast: Secondary | ICD-10-CM | POA: Diagnosis not present

## 2021-07-01 DIAGNOSIS — G5603 Carpal tunnel syndrome, bilateral upper limbs: Secondary | ICD-10-CM | POA: Insufficient documentation

## 2021-07-01 DIAGNOSIS — Z803 Family history of malignant neoplasm of breast: Secondary | ICD-10-CM | POA: Insufficient documentation

## 2021-07-01 DIAGNOSIS — R011 Cardiac murmur, unspecified: Secondary | ICD-10-CM | POA: Diagnosis not present

## 2021-07-01 DIAGNOSIS — Z801 Family history of malignant neoplasm of trachea, bronchus and lung: Secondary | ICD-10-CM | POA: Insufficient documentation

## 2021-07-01 DIAGNOSIS — Z800 Family history of malignant neoplasm of digestive organs: Secondary | ICD-10-CM | POA: Insufficient documentation

## 2021-07-01 DIAGNOSIS — E782 Mixed hyperlipidemia: Secondary | ICD-10-CM | POA: Insufficient documentation

## 2021-07-01 DIAGNOSIS — E119 Type 2 diabetes mellitus without complications: Secondary | ICD-10-CM | POA: Insufficient documentation

## 2021-07-01 DIAGNOSIS — C773 Secondary and unspecified malignant neoplasm of axilla and upper limb lymph nodes: Secondary | ICD-10-CM | POA: Diagnosis not present

## 2021-07-01 NOTE — Progress Notes (Signed)
CLINIC:  Survivorship   REASON FOR VISIT:  Routine follow-up for history of breast cancer.   BRIEF ONCOLOGIC HISTORY:  Oncology History  Breast cancer of lower-outer quadrant of right female breast ([place])  05/08/2011 Surgery   Right breast lumpectomy: Invasive lobular carcinoma, grade 1, 2.2 cm, with LCIS, 1 sentinel node positive with focal extracapsular spread T2 N1 a M0 stage IIB. ER 97%, PR 0%, HER-2 negative ratio 0.85, Ki-67 5%, BUT score 26, 17% ROR   06/26/2011 - 08/28/2011 Chemotherapy   Taxotere and Cytoxan 4 cycles    09/16/2011 - 10/22/2011 Radiation Therapy   Adjuvant radiation therapy    11/04/2011 - 07/11/2016 Anti-estrogen oral therapy   Arimidex 1 mg daily (BCI Low risk)      INTERVAL HISTORY:  Grace Carroll presents to the [facility] today for routine follow-up for her history of breast cancer.    Her next mammogram is scheduled for 07/09/2021.  Her most recent bone density was completed on 10/19/2020 and showed osteoporosis with a T score of -2.8 in the left femur.  She was prescribed Fosamax weekly.    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  She exercises when she can, she notes a family member became unexpectedly ill and she has spent an increased amount of time helping them.    She is due for her physical with her PCP in September, and plans on scheduling this today.    REVIEW OF SYSTEMS:  Review of Systems  Constitutional:  Negative for appetite change, chills, fatigue, fever and unexpected weight change.  HENT:   Negative for hearing loss, lump/mass, sore throat and trouble swallowing.   Eyes:  Negative for eye problems and icterus.  Respiratory:  Negative for chest tightness, cough and shortness of breath.   Cardiovascular:  Negative for chest pain, leg swelling and palpitations.  Gastrointestinal:  Negative for abdominal distention, abdominal pain, constipation, diarrhea, nausea and vomiting.  Endocrine: Negative for hot flashes.   Genitourinary:  Negative for difficulty urinating.   Musculoskeletal:  Negative for arthralgias and back pain.  Skin:  Negative for itching and rash.  Neurological:  Negative for dizziness, extremity weakness, headaches and numbness.  Hematological:  Negative for adenopathy. Does not bruise/bleed easily.  Psychiatric/Behavioral:  Negative for depression. The patient is not nervous/anxious.    Breast: Denies any new nodularity, masses, tenderness, nipple changes, or nipple discharge.       PAST MEDICAL/SURGICAL HISTORY:  Past Medical History:  Diagnosis Date   Breast cancer ([place])    l breast lumpectomy- xrt 1992   Cancer [facility]) right breast   CHEST PAIN 09/19/2009   Qualifier: Diagnosis of  By: Melvyn Novas MD, Christena Deem    Diabetes mellitus    diet controlled diabetic   Fatigue 11/04/2011   Headache(784.0)    occasional migraines- takes tylenol   Heart murmur    noted 2 years ago   Personal history of chemotherapy    Personal history of radiation therapy    PONV (postoperative nausea and vomiting)    Ulcer of the stomach and intestine    per h&p Dr Valere Dross 04/02/11   Past Surgical History:  Procedure Laterality Date   APPENDECTOMY     BREAST BIOPSY Right 03/25/2011   BREAST LUMPECTOMY Left 1992   BREAST LUMPECTOMY Right 2012   CARPAL TUNNEL RELEASE     bilateral   MASS EXCISION  05/07/2012   Procedure: EXCISION MASS;  Surgeon: Odis Hollingshead, MD;  Location: [facility];  Service: General;  Laterality: Right;  remove right breast mass   PORT-A-CATH REMOVAL  11/20/2011   SHOULDER ARTHROSCOPY     bone spurs    TUBAL LIGATION       ALLERGIES:  Allergies  Allergen Reactions   Codeine Nausea And Vomiting    REACTION: nausea   Zofran Anaphylaxis   Gluten Meal Diarrhea     CURRENT MEDICATIONS:  Outpatient Encounter Medications as of 07/01/2021  Medication Sig   albuterol (VENTOLIN HFA) 108 (90 Base) MCG/ACT inhaler Inhale 1 puff into the lungs 4 (four) times  daily as needed.   alendronate (FOSAMAX) 70 MG tablet TAKE 1 TABLET BY MOUTH EVERY 7 DAYS WITH A FULL GLASS OF WATER ON AN EMPTY STOMACH   budesonide-formoterol (SYMBICORT) 160-4.5 MCG/ACT inhaler Inhale 2 puffs into the lungs as needed.    Calcium Carbonate-Vitamin D (RA CALCIUM PLUS VITAMIN D) 600-200 MG-UNIT TABS Take by mouth daily.    pantoprazole (PROTONIX) 40 MG tablet Take 40 mg by mouth daily.   [DISCONTINUED] sucralfate (CARAFATE) 1 g tablet Take 1 g by mouth 4 (four) times daily.   No facility-administered encounter medications on file as of 07/01/2021.     ONCOLOGIC FAMILY HISTORY:  Family History  Problem Relation Age of Onset   Cancer Mother        cervical   Heart disease Mother    Heart disease Father    Cancer Sister        lung and brain   Cancer Sister 62       breast   Breast cancer Sister    Cancer Sister        colon   Cancer Brother        brain    GENETIC COUNSELING/TESTING: Recommended; patient declined due to ambiguity over cost  SOCIAL HISTORY:  Social History   Socioeconomic History   Marital status: Married    Spouse name: Not on file   Number of children: Not on file   Years of education: Not on file   Highest education level: Not on file  Occupational History   Not on file  Tobacco Use   Smoking status: Never   Smokeless tobacco: Never  Substance and Sexual Activity   Alcohol use: No   Drug use: No   Sexual activity: Yes  Other Topics Concern   Not on file  Social History Narrative   Not on file   Social Determinants of Health   Financial Resource Strain: Not on file  Food Insecurity: Not on file  Transportation Needs: Not on file  Physical Activity: Not on file  Stress: Not on file  Social Connections: Not on file  Intimate Partner Violence: Not on file      PHYSICAL EXAMINATION:  Vital Signs: Vitals:   07/01/21 0910  BP: 129/68  Pulse: 81  Resp: 18  Temp: (!) 97.5 F (36.4 C)  SpO2: 98%    Filed Weights    07/01/21 0910  Weight: 163 lb 1.6 oz (74 kg)    General: Well-nourished, well-appearing female in no acute distress.  Unaccompanied today.   HEENT: Head is normocephalic.  Pupils equal and reactive to light. Conjunctivae clear without exudate.  Sclerae anicteric. Oral mucosa is pink, moist.  Oropharynx is pink without lesions or erythema.  Lymph: No cervical, supraclavicular, or infraclavicular lymphadenopathy noted on palpation.  Cardiovascular: Regular rate and rhythm.Marland Kitchen Respiratory: Clear to auscultation bilaterally. Chest expansion symmetric; breathing non-labored.  Breast Exam:  -  Left breast: No appreciable masses on palpation. No skin redness, thickening, or peau d'orange appearance; no nipple retraction or nipple discharge; mild distortion in symmetry at previous lumpectomy site well healed scar without erythema or nodularity.  -Right breast: No appreciable masses on palpation. No skin redness, thickening, or peau d'orange appearance; no nipple retraction or nipple discharge; mild distortion in symmetry at previous lumpectomy site well healed scar without erythema or nodularity. -Axilla: No axillary adenopathy bilaterally.  GI: Abdomen soft and round; non-tender, non-distended. Bowel sounds normoactive. No hepatosplenomegaly.   GU: Deferred.  Neuro: No focal deficits. Steady gait.  Psych: Mood and affect normal and appropriate for situation.  MSK: No focal spinal tenderness to palpation, full range of motion in bilateral upper extremities Extremities: No edema. Skin: Warm and dry.  LABORATORY DATA:  None for this visit   DIAGNOSTIC IMAGING:  Most recent mammogram:  Completed today, awaiting reuslts    ASSESSMENT AND PLAN:  Ms.. Gadway is a pleasant 70 y.o. female with history of Stage IIB right breast invasive ductal carcinoma, ER+/PR-/HER2-, diagnosed in 05/2011, treated with lumpectomy, adjuvant chemotherapy, adjuvant radiation therapy, and anti-estrogen therapy with Anastrozole  x 5 years completed in 07/2016 (BCI low risk).  She [facility] for surveillance and routine follow-up.   1. History of breast cancer:  Grace Carroll is currently clinically and radiographically without evidence of disease or recurrence of breast cancer.  Her mammogram is scheduled next week.  She will return in one year for continued surveillance and monitoring with LTS follow up.  I encouraged her to call me with any questions or concerns before her next visit at the cancer center, and I would be happy to see her sooner, if needed.    2. Bone health:  Given Grace Carroll's age, history of breast cancer, and her previous anti-estrogen therapy with Anastrozole, she is at risk for bone demineralization.  Her most recent bone density was performed in 10/2020 and was consistent with osteoporosis.  It is slightly worse than the bone density completed in 2015.  She is taking weekly fosamax.  We will continue her current treatment and repeat testing in 10/2022, I am hoping it will be improved since she has stopped taking Anastrozole.  She was given education on specific food and activities to promote bone health.  3. Diabetes: This is controlled by diet and managed by her PCP, she will continue her f/u with them.  5. Cancer screening:  Due to Ms. Kellman's history and her age, she should receive screening for skin cancers, colon cancer. She was encouraged to follow-up with her PCP for appropriate cancer screenings. I updated her health maintenance list and she plans on f/u with her PCP regarding shingles vaccination, Hepatitis C screening, and TDAP due dates.    6. Health maintenance and wellness promotion: Grace Carroll was encouraged to consume 5-7 servings of fruits and vegetables per day. She was also encouraged to engage in moderate to vigorous exercise for 30 minutes per day most days of the week. She was instructed to limit her alcohol consumption and continue to abstain from tobacco use.       Dispo:  -Return to cancer center in one year for LTS follow up -Mammogram in 07/2021 -Bone density 10/2022    Total encounter time: 26 minutes* in face to face visit time, chart review, order entry, care coordination, and documentation of the encounter.   Wilber Bihari, NP 07/01/21 9:57 AM Medical Oncology and Hematology [facility]  [place], [place] [address] Tel. [phone]    Fax. [phone]  *Total Encounter Time as defined by the Centers for Medicare and Medicaid Services includes, in addition to the face-to-face time of a patient visit (documented in the note above) non-face-to-face time: obtaining and reviewing outside history, ordering and reviewing medications, tests or procedures, care coordination (communications with other health care professionals or caregivers) and documentation in the medical record.    Note: PRIMARY CARE PROVIDER Raelene Bott, [place] [phone]

## 2021-07-02 ENCOUNTER — Encounter: Payer: Medicare HMO | Admitting: Adult Health

## 2021-07-09 ENCOUNTER — Ambulatory Visit
Admission: RE | Admit: 2021-07-09 | Discharge: 2021-07-09 | Disposition: A | Discharge status: Home / Self Care | Payer: Medicare HMO | Source: Ambulatory Visit | Attending: Internal Medicine | Admitting: Internal Medicine

## 2021-07-09 ENCOUNTER — Other Ambulatory Visit: Payer: Self-pay

## 2021-07-09 DIAGNOSIS — Z1231 Encounter for screening mammogram for malignant neoplasm of breast: Secondary | ICD-10-CM

## 2022-05-05 ENCOUNTER — Other Ambulatory Visit: Payer: Self-pay | Admitting: Gastroenterology

## 2022-05-05 ENCOUNTER — Other Ambulatory Visit: Payer: Self-pay | Admitting: Internal Medicine

## 2022-05-05 DIAGNOSIS — Z1231 Encounter for screening mammogram for malignant neoplasm of breast: Secondary | ICD-10-CM

## 2022-06-24 ENCOUNTER — Other Ambulatory Visit: Payer: Self-pay

## 2022-06-24 ENCOUNTER — Inpatient Hospital Stay: Payer: Medicare HMO | Attending: Adult Health | Admitting: Adult Health

## 2022-06-24 ENCOUNTER — Encounter: Payer: Self-pay | Admitting: Adult Health

## 2022-06-24 VITALS — BP 149/77 | HR 84 | Temp 97.5°F | Resp 18 | Ht 68.0 in | Wt 162.3 lb

## 2022-06-24 DIAGNOSIS — Z170 Estrogen receptor positive status [ER+]: Secondary | ICD-10-CM | POA: Insufficient documentation

## 2022-06-24 DIAGNOSIS — C50511 Malignant neoplasm of lower-outer quadrant of right female breast: Secondary | ICD-10-CM | POA: Insufficient documentation

## 2022-06-24 DIAGNOSIS — Z853 Personal history of malignant neoplasm of breast: Secondary | ICD-10-CM | POA: Diagnosis not present

## 2022-06-24 DIAGNOSIS — K219 Gastro-esophageal reflux disease without esophagitis: Secondary | ICD-10-CM | POA: Diagnosis not present

## 2022-06-24 DIAGNOSIS — Z9221 Personal history of antineoplastic chemotherapy: Secondary | ICD-10-CM | POA: Diagnosis not present

## 2022-06-24 DIAGNOSIS — E782 Mixed hyperlipidemia: Secondary | ICD-10-CM | POA: Diagnosis not present

## 2022-06-24 DIAGNOSIS — Z801 Family history of malignant neoplasm of trachea, bronchus and lung: Secondary | ICD-10-CM | POA: Insufficient documentation

## 2022-06-24 DIAGNOSIS — Z79811 Long term (current) use of aromatase inhibitors: Secondary | ICD-10-CM | POA: Diagnosis not present

## 2022-06-24 DIAGNOSIS — E119 Type 2 diabetes mellitus without complications: Secondary | ICD-10-CM | POA: Diagnosis not present

## 2022-06-24 DIAGNOSIS — M5136 Other intervertebral disc degeneration, lumbar region: Secondary | ICD-10-CM | POA: Diagnosis not present

## 2022-06-24 DIAGNOSIS — Z923 Personal history of irradiation: Secondary | ICD-10-CM | POA: Insufficient documentation

## 2022-06-24 DIAGNOSIS — G5603 Carpal tunnel syndrome, bilateral upper limbs: Secondary | ICD-10-CM | POA: Insufficient documentation

## 2022-06-24 DIAGNOSIS — E538 Deficiency of other specified B group vitamins: Secondary | ICD-10-CM | POA: Insufficient documentation

## 2022-06-24 DIAGNOSIS — M810 Age-related osteoporosis without current pathological fracture: Secondary | ICD-10-CM | POA: Insufficient documentation

## 2022-06-24 DIAGNOSIS — E559 Vitamin D deficiency, unspecified: Secondary | ICD-10-CM | POA: Diagnosis not present

## 2022-06-24 NOTE — Assessment & Plan Note (Signed)
Grace Carroll is a 71 year old woman with history of right-sided breast cancer status post lumpectomy in June 2012, adjuvant chemotherapy, adjuvant radiation, and 5 years of antiestrogen therapy with anastrozole which completed in August 2017.  Vicente Males has no clinical or radiographic sign of breast cancer recurrence.  She does have some osteoarthritis and this is managed by her primary care provider.  We discussed that her colonoscopy is due this year and she plans on getting that scheduled at [place].  She is due for repeat mammogram in August which is already scheduled.  I recommended healthy diet and exercise and we will see her back in 1 year for continued surveillance at her request.

## 2022-06-24 NOTE — Progress Notes (Signed)
[facility] Follow up:    Grace Bott, MD [address] [place] [address]   DIAGNOSIS: Stage IIB breast cancer  SUMMARY OF ONCOLOGIC HISTORY: Oncology History  Breast cancer of lower-outer quadrant of right female breast ([place]) (Res[place])  05/08/2011 Surgery   Right breast lumpectomy: Invasive lobular carcinoma, grade 1, 2.2 cm, with LCIS, 1 sentinel node positive with focal extracapsular spread T2 N1 a M0 stage IIB. ER 97%, PR 0%, HER-2 negative ratio 0.85, Ki-67 5%, BUT score 26, 17% ROR   06/26/2011 - 08/28/2011 Chemotherapy   Taxotere and Cytoxan 4 cycles   09/16/2011 - 10/22/2011 Radiation Therapy   Adjuvant radiation therapy   11/04/2011 - 07/11/2016 Anti-estrogen oral therapy   Arimidex 1 mg daily (BCI Low risk)     CURRENT THERAPY: observation  INTERVAL HISTORY: Grace Carroll 71 y.o. female returns for f/u and monitoring of her h/o right sided estrogen positive breast cancer.  Her most recent mammogram was completed in 07/2021 and showed no evidence of malignancy and breast density category B.     Patient Active Problem List   Diagnosis Date Noted   History of right breast cancer 06/24/2022   Age-related osteoporosis without current pathological fracture 07/01/2021   Borderline diabetes 07/01/2021   Carpal tunnel syndrome on both sides 07/01/2021   Mixed hyperlipidemia 07/01/2021   Type 2 diabetes mellitus without complication, without long-term current use of insulin ([place]) 07/01/2021   DDD (degenerative disc disease), lumbar 10/13/2016   GERD (gastroesophageal reflux disease) 06/28/2013   Vitamin B 12 deficiency 06/28/2013   Vitamin D deficiency 06/28/2013   Nonspecific (abnormal) findings on radiological and other examination of body structure 09/19/2009   ABNORMAL LUNG XRAY 09/19/2009    is allergic to codeine, zofran, and gluten meal.  MEDICAL HISTORY: Past Medical History:  Diagnosis Date   Breast cancer ([place])     l breast lumpectomy- xrt 1992   Cancer ([place]) right breast   CHEST PAIN 09/19/2009   Qualifier: Diagnosis of  By: Melvyn Novas MD, Christena Deem    Diabetes mellitus    diet controlled diabetic   Fatigue 11/04/2011   Headache(784.0)    occasional migraines- takes tylenol   Heart murmur    noted 2 years ago   Personal history of chemotherapy    Personal history of radiation therapy    PONV (postoperative nausea and vomiting)    Ulcer of the stomach and intestine    per h&p Dr Valere Dross 04/02/11    SURGICAL HISTORY: Past Surgical History:  Procedure Laterality Date   APPENDECTOMY     BREAST BIOPSY Right 03/25/2011   BREAST LUMPECTOMY Left 1992   BREAST LUMPECTOMY Right 2012   CARPAL TUNNEL RELEASE     bilateral   MASS EXCISION  05/07/2012   Procedure: EXCISION MASS;  Surgeon: Odis Hollingshead, MD;  Location: [facility];  Service: General;  Laterality: Right;  remove right breast mass   PORT-A-CATH REMOVAL  11/20/2011   SHOULDER ARTHROSCOPY     bone spurs    TUBAL LIGATION      SOCIAL HISTORY: Social History   Socioeconomic History   Marital status: Married    Spouse name: Not on file   Number of children: Not on file   Years of education: Not on file   Highest education level: Not on file  Occupational History   Not on file  Tobacco Use   Smoking status: Never   Smokeless  tobacco: Never  Substance and Sexual Activity   Alcohol use: No   Drug use: No   Sexual activity: Yes  Other Topics Concern   Not on file  Social History Narrative   Not on file   Social Determinants of Health   Financial Resource Strain: Not on file  Food Insecurity: Not on file  Transportation Needs: Not on file  Physical Activity: Not on file  Stress: Not on file  Social Connections: Not on file  Intimate Partner Violence: Not on file    FAMILY HISTORY: Family History  Problem Relation Age of Onset   Cancer Mother        cervical   Heart disease Mother    Heart disease Father     Cancer Sister        lung and brain   Cancer Sister 65       breast   Breast cancer Sister    Cancer Sister        colon   Cancer Brother        brain    Review of Systems  Constitutional:  Negative for appetite change, chills, fatigue, fever and unexpected weight change.  HENT:   Negative for hearing loss, lump/mass and trouble swallowing.   Eyes:  Negative for eye problems and icterus.  Respiratory:  Negative for chest tightness, cough and shortness of breath.   Cardiovascular:  Negative for chest pain, leg swelling and palpitations.  Gastrointestinal:  Negative for abdominal distention, abdominal pain, constipation, diarrhea, nausea and vomiting.  Endocrine: Negative for hot flashes.  Genitourinary:  Negative for difficulty urinating.   Musculoskeletal:  Negative for arthralgias.  Skin:  Negative for itching and rash.  Neurological:  Negative for dizziness, extremity weakness, headaches and numbness.  Hematological:  Negative for adenopathy. Does not bruise/bleed easily.  Psychiatric/Behavioral:  Negative for depression. The patient is not nervous/anxious.       PHYSICAL EXAMINATION  ECOG PERFORMANCE STATUS: 0 - Asymptomatic  Vitals:   06/24/22 0912  BP: (!) 149/77  Pulse: 84  Resp: 18  Temp: (!) 97.5 F (36.4 C)  SpO2: 98%    Physical Exam Constitutional:      General: Grace Carroll is not in acute distress.    Appearance: Normal appearance. Grace Carroll is not toxic-appearing.  HENT:     Head: Normocephalic and atraumatic.  Eyes:     General: No scleral icterus. Cardiovascular:     Rate and Rhythm: Normal rate and regular rhythm.     Pulses: Normal pulses.     Heart sounds: Normal heart sounds.  Pulmonary:     Effort: Pulmonary effort is normal.     Breath sounds: Normal breath sounds.  Chest:     Comments: Right breast s/p lumpectomy and radiation, nos ign of local recurrence, left breast benign Abdominal:     General: Abdomen is flat. Bowel sounds are normal. There  is no distension.     Palpations: Abdomen is soft.     Tenderness: There is no abdominal tenderness.  Musculoskeletal:        General: No swelling.     Cervical back: Neck supple.  Lymphadenopathy:     Cervical: No cervical adenopathy.  Skin:    General: Skin is warm and dry.     Findings: No rash.  Neurological:     General: No focal deficit present.     Mental Status: Grace Carroll is alert.  Psychiatric:        Mood and Affect: Mood  normal.        Behavior: Behavior normal.     LABORATORY DATA:  CBC    Component Value Date/Time   WBC 8.4 06/21/2015 1333   WBC 4.9 05/06/2012 1525   RBC 4.44 06/21/2015 1333   RBC 4.23 05/06/2012 1525   HGB 13.4 06/21/2015 1333   HCT 40.4 06/21/2015 1333   PLT 207 06/21/2015 1333   MCV 91.0 06/21/2015 1333   MCH 30.2 06/21/2015 1333   MCH 29.8 05/06/2012 1525   MCHC 33.2 06/21/2015 1333   MCHC 33.8 05/06/2012 1525   RDW 13.3 06/21/2015 1333   LYMPHSABS 0.9 06/21/2015 1333   MONOABS 0.3 06/21/2015 1333   EOSABS 0.0 06/21/2015 1333   EOSABS 0.2 07/02/2011 1149   BASOSABS 0.0 06/21/2015 1333    CMP     Component Value Date/Time   NA 139 06/21/2015 1334   K 4.8 06/21/2015 1334   CL 105 12/16/2012 1355   CO2 26 06/21/2015 1334   GLUCOSE 109 06/21/2015 1334   GLUCOSE 100 (H) 12/16/2012 1355   BUN 18.0 06/21/2015 1334   CREATININE 1.0 06/21/2015 1334   CALCIUM 9.4 06/21/2015 1334   PROT 6.6 06/21/2015 1334   ALBUMIN 4.2 06/21/2015 1334   AST 15 06/21/2015 1334   ALT 15 06/21/2015 1334   ALKPHOS 68 06/21/2015 1334   BILITOT 0.55 06/21/2015 1334   GFRNONAA 75 (L) 05/06/2012 1525   GFRAA 87 (L) 05/06/2012 1525      ASSESSMENT and THERAPY PLAN:   History of right breast cancer Grace Carroll is a 71 year old woman with history of right-sided breast cancer status post lumpectomy in June 2012, adjuvant chemotherapy, adjuvant radiation, and 5 years of antiestrogen therapy with anastrozole which completed in August 2017.  Grace Carroll has no clinical or  radiographic sign of breast cancer recurrence.  Grace Carroll does have some osteoarthritis and this is managed by her primary care provider.  We discussed that her colonoscopy is due this year and Grace Carroll plans on getting that scheduled at [facility].  Grace Carroll is due for repeat mammogram in August which is already scheduled.  I recommended healthy diet and exercise and we will see her back in 1 year for continued surveillance at her request.    All questions were answered. The patient knows to call the clinic with any problems, questions or concerns. We can certainly see the patient much sooner if necessary.  Total encounter time:20 minutes*in face-to-face visit time, chart review, lab review, care coordination, order entry, and documentation of the encounter time.    Wilber Bihari, NP 06/24/22 9:51 AM Medical Oncology and Hematology [facility] [place], [place] [address] Tel. [phone]    Fax. [phone]  *Total Encounter Time as defined by the Centers for Medicare and Medicaid Services includes, in addition to the face-to-face time of a patient visit (documented in the note above) non-face-to-face time: obtaining and reviewing outside history, ordering and reviewing medications, tests or procedures, care coordination (communications with other health care professionals or caregivers) and documentation in the medical record.

## 2022-07-01 ENCOUNTER — Encounter: Payer: Medicare HMO | Admitting: Adult Health

## 2022-07-14 ENCOUNTER — Ambulatory Visit: Payer: Medicare HMO

## 2022-07-28 ENCOUNTER — Ambulatory Visit
Admission: RE | Admit: 2022-07-28 | Discharge: 2022-07-28 | Disposition: A | Discharge status: Home / Self Care | Payer: Medicare HMO | Source: Ambulatory Visit | Attending: Internal Medicine | Admitting: Internal Medicine

## 2022-07-28 DIAGNOSIS — Z1231 Encounter for screening mammogram for malignant neoplasm of breast: Secondary | ICD-10-CM

## 2023-06-05 ENCOUNTER — Telehealth: Payer: Self-pay | Admitting: Adult Health

## 2023-06-05 NOTE — Telephone Encounter (Signed)
Rescheduled appointment per provider template changes. Patient is aware of the changes made to her upcoming appointment. ?

## 2023-06-19 ENCOUNTER — Other Ambulatory Visit: Payer: Self-pay | Admitting: Internal Medicine

## 2023-06-19 DIAGNOSIS — Z1231 Encounter for screening mammogram for malignant neoplasm of breast: Secondary | ICD-10-CM

## 2023-06-29 ENCOUNTER — Inpatient Hospital Stay: Payer: Medicare HMO | Attending: Adult Health | Admitting: Adult Health

## 2023-06-29 ENCOUNTER — Encounter: Payer: Self-pay | Admitting: Adult Health

## 2023-06-29 ENCOUNTER — Encounter: Payer: Medicare HMO | Admitting: Adult Health

## 2023-06-29 ENCOUNTER — Other Ambulatory Visit: Payer: Self-pay

## 2023-06-29 VITALS — BP 146/78 | HR 85 | Temp 98.4°F | Resp 17 | Wt 163.9 lb

## 2023-06-29 DIAGNOSIS — Z9221 Personal history of antineoplastic chemotherapy: Secondary | ICD-10-CM | POA: Insufficient documentation

## 2023-06-29 DIAGNOSIS — Z853 Personal history of malignant neoplasm of breast: Secondary | ICD-10-CM

## 2023-06-29 DIAGNOSIS — C50511 Malignant neoplasm of lower-outer quadrant of right female breast: Secondary | ICD-10-CM | POA: Insufficient documentation

## 2023-06-29 DIAGNOSIS — Z170 Estrogen receptor positive status [ER+]: Secondary | ICD-10-CM | POA: Diagnosis not present

## 2023-06-29 DIAGNOSIS — Z923 Personal history of irradiation: Secondary | ICD-10-CM | POA: Diagnosis not present

## 2023-06-29 DIAGNOSIS — Z79811 Long term (current) use of aromatase inhibitors: Secondary | ICD-10-CM | POA: Diagnosis not present

## 2023-06-29 NOTE — Progress Notes (Signed)
[facility] Cancer Follow up:    Grace Qua, MD [address] [place] [address]   DIAGNOSIS: History of right breast cancer  SUMMARY OF ONCOLOGIC HISTORY: Oncology History  Breast cancer of lower-outer quadrant of right female breast (HCC) (Resolved)  05/08/2011 Surgery   Right breast lumpectomy: Invasive lobular carcinoma, grade 1, 2.2 cm, with LCIS, 1 sentinel node positive with focal extracapsular spread T2 N1 a M0 stage IIB. ER 97%, PR 0%, HER-2 negative ratio 0.85, Ki-67 5%, BUT score 26, 17% ROR   06/26/2011 - 08/28/2011 Chemotherapy   Taxotere and Cytoxan 4 cycles   09/16/2011 - 10/22/2011 Radiation Therapy   Adjuvant radiation therapy   11/04/2011 - 07/11/2016 Anti-estrogen oral therapy   Arimidex 1 mg daily (BCI Low risk)     CURRENT THERAPY: Observation  INTERVAL HISTORY: Grace Carroll 72 y.o. female returns for follow-up of her history of breast cancer.  Most recent mammogram occurred on July 28, 2022 showing no mammographic evidence of malignancy and breast density category B.  She is doing well.  Since her last visit she underwent 2 upper endoscopies.  In the first 1 they noticed a lesion that they were unable to biopsy and so she was sent to [facility] and they attempted another biopsy but were unsuccessful due to the location and the potential for puncturing her heart.  This lesion will be monitored annually with an EGD with consideration of rebiopsy if it becomes larger than a centimeter.  Grace Carroll continues to exercise as much as she is able considering her arthritis.  She is feeling well and denies any significant issues.  Patient Active Problem List   Diagnosis Date Noted   History of right breast cancer 06/24/2022   Age-related osteoporosis without current pathological fracture 07/01/2021   Borderline diabetes 07/01/2021   Carpal tunnel syndrome on both sides 07/01/2021   Mixed hyperlipidemia 07/01/2021   Type 2 diabetes  mellitus without complication, without long-term current use of insulin (HCC) 07/01/2021   DDD (degenerative disc disease), lumbar 10/13/2016   GERD (gastroesophageal reflux disease) 06/28/2013   Vitamin B 12 deficiency 06/28/2013   Vitamin D deficiency 06/28/2013   Nonspecific (abnormal) findings on radiological and other examination of body structure 09/19/2009   ABNORMAL LUNG XRAY 09/19/2009    is allergic to codeine, zofran, and gluten meal.  MEDICAL HISTORY: Past Medical History:  Diagnosis Date   Breast cancer (HCC)    l breast lumpectomy- xrt 1992   Cancer (HCC) right breast   CHEST PAIN 09/19/2009   Qualifier: Diagnosis of  By: Sherene Sires MD, Charlaine Dalton    Diabetes mellitus    diet controlled diabetic   Fatigue 11/04/2011   Headache(784.0)    occasional migraines- takes tylenol   Heart murmur    noted 2 years ago   Personal history of chemotherapy    Personal history of radiation therapy    PONV (postoperative nausea and vomiting)    Ulcer of the stomach and intestine    per h&p Dr Dayton Scrape 04/02/11    SURGICAL HISTORY: Past Surgical History:  Procedure Laterality Date   APPENDECTOMY     BREAST BIOPSY Right 03/25/2011   BREAST LUMPECTOMY Left 1992   BREAST LUMPECTOMY Right 2012   CARPAL TUNNEL RELEASE     bilateral   MASS EXCISION  05/07/2012   Procedure: EXCISION MASS;  Surgeon: Adolph Pollack, MD;  Location: [facility];  Service: General;  Laterality: Right;  remove right breast mass   PORT-A-CATH REMOVAL  11/20/2011   SHOULDER ARTHROSCOPY     bone spurs    TUBAL LIGATION      SOCIAL HISTORY: Social History   Socioeconomic History   Marital status: Married    Spouse name: Not on file   Number of children: Not on file   Years of education: Not on file   Highest education level: Not on file  Occupational History   Not on file  Tobacco Use   Smoking status: Never   Smokeless tobacco: Never  Substance and Sexual Activity   Alcohol use: No    Drug use: No   Sexual activity: Yes  Other Topics Concern   Not on file  Social History Narrative   Not on file   Social Determinants of Health   Financial Resource Strain: Low Risk  (05/01/2022)   Received from [facility], [facility]   Overall Financial Resource Strain (CARDIA)    Difficulty of Paying Living Expenses: Not hard at all  Food Insecurity: No Food Insecurity (05/01/2022)   Received from [facility], [facility]   Hunger Vital Sign    Worried About Running Out of Food in the Last Year: Never true    Ran Out of Food in the Last Year: Never true  Transportation Needs: No Transportation Needs (05/01/2022)   Received from [facility], [facility] [place] [facility]   [facility] - Transportation    Lack of Transportation (Medical): No    Lack of Transportation (Non-Medical): No  Physical Activity: Insufficiently Active (05/01/2022)   Received from [facility], [facility]   Exercise Vital Sign    Days of Exercise per Week: 3 days    Minutes of Exercise per Session: 30 min  Stress: No Stress Concern Present (05/01/2022)   Received from [facility], [facility] of Occupational Health - Occupational Stress Questionnaire    Feeling of Stress : Only a little  Social Connections: Socially Integrated (05/01/2022)   Received from [facility], [facility]   Social Connection and Isolation Panel [NHANES]    Frequency of Communication with Friends and Family: More than three times a week    Frequency of Social Gatherings with Friends and Family: More than three times a week    Attends Religious Services: More than 4 times per year    Active Member of [employer] or Organizations: Yes    Attends Engineer, structural: More than 4 times per year    Marital Status: Married  Catering manager Violence: Not At Risk (05/01/2022)   Received from [facility], [facility]   Humiliation, Afraid, Rape, and Kick questionnaire    Fear of Current  or Ex-Partner: No    Emotionally Abused: No    Physically Abused: No    Sexually Abused: No    FAMILY HISTORY: Family History  Problem Relation Age of Onset   Cancer Mother        cervical   Heart disease Mother    Heart disease Father    Cancer Sister        lung and brain   Cancer Sister 43       breast   Breast cancer Sister    Cancer Sister        colon   Cancer Brother        brain    Review of Systems  Constitutional:  Negative for appetite change, chills, fatigue, fever and unexpected weight change.  HENT:   Negative for hearing loss, lump/mass and trouble swallowing.   Eyes:  Negative for eye problems and icterus.  Respiratory:  Negative for chest tightness, cough and shortness of breath.   Cardiovascular:  Negative for chest pain, leg swelling and palpitations.  Gastrointestinal:  Negative for abdominal distention, abdominal pain, constipation, diarrhea, nausea and vomiting.  Endocrine: Negative for hot flashes.  Genitourinary:  Negative for difficulty urinating.   Musculoskeletal:  Positive for arthralgias.  Skin:  Negative for itching and rash.  Neurological:  Negative for dizziness, extremity weakness, headaches and numbness.  Hematological:  Negative for adenopathy. Does not bruise/bleed easily.  Psychiatric/Behavioral:  Negative for depression. The patient is not nervous/anxious.       PHYSICAL EXAMINATION   Onc Performance Status - 06/29/23 0841       ECOG Perf Status   ECOG Perf Status Restricted in physically strenuous activity but ambulatory and able to carry out work of a light or sedentary nature, e.g., light house work, office work      KPS SCALE   KPS % SCORE Able to carry on normal activity, minor s/s of disease             Vitals:   06/29/23 0841  BP: (!) 146/78  Pulse: 85  Resp: 17  Temp: 98.4 F (36.9 C)  SpO2: 96%    Physical Exam Constitutional:      General: She is not in acute distress.    Appearance: Normal  appearance. She is not toxic-appearing.  HENT:     Head: Normocephalic and atraumatic.     Mouth/Throat:     Mouth: Mucous membranes are moist.     Pharynx: Oropharynx is clear. No oropharyngeal exudate or posterior oropharyngeal erythema.  Eyes:     General: No scleral icterus. Cardiovascular:     Rate and Rhythm: Normal rate and regular rhythm.     Pulses: Normal pulses.     Heart sounds: Normal heart sounds.  Pulmonary:     Effort: Pulmonary effort is normal.     Breath sounds: Normal breath sounds.  Chest:     Comments: Right breast status postlumpectomy and radiation no sign of local recurrence left breast is benign. Abdominal:     General: Abdomen is flat. Bowel sounds are normal. There is no distension.     Palpations: Abdomen is soft.     Tenderness: There is no abdominal tenderness.  Musculoskeletal:        General: No swelling.     Cervical back: Neck supple.  Lymphadenopathy:     Cervical: No cervical adenopathy.  Skin:    General: Skin is warm and dry.     Findings: No rash.  Neurological:     General: No focal deficit present.     Mental Status: She is alert.  Psychiatric:        Mood and Affect: Mood normal.        Behavior: Behavior normal.        ASSESSMENT and THERAPY PLAN:   History of right breast cancer Grace Carroll is a 72 year old woman with history of right-sided invasive lobular carcinoma diagnosed in June 2012 status post lumpectomy followed by adjuvant chemotherapy followed by adjuvant radiation and anastrozole that she completed in August 2017.  History of breast cancer: She has no clinical or radiographic signs of breast cancer recurrence.  She will continue with annual mammograms next due in August  of this year. Esophageal lesion: This was in a difficult location to biopsy.  She will continue to undergo EGD annually for surveillance and if the lesion becomes larger between 1 to 2 cm she can undergo a different biopsy approach.   Health maintenance:  We reviewed healthy diet and exercise.  She continues to see her primary care provider regularly.  Grace Carroll will return in 1 year for continued long-term follow-up.  She knows to call for any questions or concerns that may arise between now and her next appointment.   All questions were answered. The patient knows to call the clinic with any problems, questions or concerns. We can certainly see the patient much sooner if necessary.  Total encounter time:20 minutes*in face-to-face visit time, chart review, lab review, care coordination, order entry, and documentation of the encounter time.    Lillard Anes, NP 06/29/23 9:10 AM Medical Oncology and Hematology [facility] [address] [place], [place] [address] Tel. [phone]    Fax. [phone]  *Total Encounter Time as defined by the Centers for Medicare and Medicaid Services includes, in addition to the face-to-face time of a patient visit (documented in the note above) non-face-to-face time: obtaining and reviewing outside history, ordering and reviewing medications, tests or procedures, care coordination (communications with other health care professionals or caregivers) and documentation in the medical record.

## 2023-06-29 NOTE — Assessment & Plan Note (Signed)
Grace Carroll is a 72 year old woman with history of right-sided invasive lobular carcinoma diagnosed in June 2012 status post lumpectomy followed by adjuvant chemotherapy followed by adjuvant radiation and anastrozole that she completed in August 2017.  History of breast cancer: She has no clinical or radiographic signs of breast cancer recurrence.  She will continue with annual mammograms next due in August of this year. Esophageal lesion: This was in a difficult location to biopsy.  She will continue to undergo EGD annually for surveillance and if the lesion becomes larger between 1 to 2 cm she can undergo a different biopsy approach.   Health maintenance: We reviewed healthy diet and exercise.  She continues to see her primary care provider regularly.  Rhealynn will return in 1 year for continued long-term follow-up.  She knows to call for any questions or concerns that may arise between now and her next appointment.

## 2023-07-31 ENCOUNTER — Ambulatory Visit: Payer: Medicare HMO

## 2023-08-04 ENCOUNTER — Ambulatory Visit
Admission: RE | Admit: 2023-08-04 | Discharge: 2023-08-04 | Disposition: A | Discharge status: Home / Self Care | Payer: Medicare HMO | Source: Ambulatory Visit | Attending: Internal Medicine | Admitting: Internal Medicine

## 2023-08-04 DIAGNOSIS — Z1231 Encounter for screening mammogram for malignant neoplasm of breast: Secondary | ICD-10-CM

## 2024-06-24 ENCOUNTER — Other Ambulatory Visit: Payer: Self-pay | Admitting: Internal Medicine

## 2024-06-24 DIAGNOSIS — Z1231 Encounter for screening mammogram for malignant neoplasm of breast: Secondary | ICD-10-CM

## 2024-06-28 ENCOUNTER — Encounter: Payer: Self-pay | Admitting: Adult Health

## 2024-06-28 ENCOUNTER — Inpatient Hospital Stay: Payer: Medicare HMO | Attending: Adult Health | Admitting: Adult Health

## 2024-06-28 VITALS — BP 139/67 | HR 82 | Temp 97.6°F | Resp 16 | Ht 67.0 in | Wt 162.0 lb

## 2024-06-28 DIAGNOSIS — N8110 Cystocele, unspecified: Secondary | ICD-10-CM | POA: Diagnosis not present

## 2024-06-28 DIAGNOSIS — Z803 Family history of malignant neoplasm of breast: Secondary | ICD-10-CM | POA: Insufficient documentation

## 2024-06-28 DIAGNOSIS — Z808 Family history of malignant neoplasm of other organs or systems: Secondary | ICD-10-CM | POA: Diagnosis not present

## 2024-06-28 DIAGNOSIS — Z923 Personal history of irradiation: Secondary | ICD-10-CM | POA: Diagnosis not present

## 2024-06-28 DIAGNOSIS — Z800 Family history of malignant neoplasm of digestive organs: Secondary | ICD-10-CM | POA: Diagnosis not present

## 2024-06-28 DIAGNOSIS — Z801 Family history of malignant neoplasm of trachea, bronchus and lung: Secondary | ICD-10-CM | POA: Diagnosis not present

## 2024-06-28 DIAGNOSIS — Z853 Personal history of malignant neoplasm of breast: Secondary | ICD-10-CM | POA: Insufficient documentation

## 2024-06-28 DIAGNOSIS — Z9221 Personal history of antineoplastic chemotherapy: Secondary | ICD-10-CM | POA: Diagnosis not present

## 2024-06-28 NOTE — Progress Notes (Unsigned)
 [facility] Cancer Follow up:    Rosan Mix, MD [address] [place] [address]   DIAGNOSIS: History of right breast cancer   SUMMARY OF ONCOLOGIC HISTORY: Oncology History  Breast cancer of lower-outer quadrant of right female breast (HCC) (Resolved)  05/08/2011 Surgery   Right breast lumpectomy: Invasive lobular carcinoma, grade 1, 2.2 cm, with LCIS, 1 sentinel node positive with focal extracapsular spread T2 N1 a M0 stage IIB. ER 97%, PR 0%, HER-2 negative ratio 0.85, Ki-67 5%, BUT score 26, 17% ROR   06/26/2011 - 08/28/2011 Chemotherapy   Taxotere and Cytoxan 4 cycles   09/16/2011 - 10/22/2011 Radiation Therapy   Adjuvant radiation therapy   11/04/2011 - 07/11/2016 Anti-estrogen oral therapy   Arimidex  1 mg daily (BCI Low risk)     CURRENT THERAPY: observation  INTERVAL HISTORY:  Discussed the use of AI scribe software for clinical note transcription with the patient, who gave verbal consent to proceed.  History of Present Illness Grace Carroll is a 73 year old female with right breast invasive lobular carcinoma who presents for routine follow-up.  She was diagnosed with right breast invasive lobular carcinoma, ER positive, in 2012. She underwent a lumpectomy, followed by adjuvant chemotherapy and radiation. She completed five years of antiestrogen therapy with anastrozole  and is currently on observation alone. Her last bilateral breast 3D screening mammogram on August 04, 2023, showed no mammographic evidence of malignancy and breast density category B.     Patient Active Problem List   Diagnosis Date Noted   History of right breast cancer 06/24/2022   Age-related osteoporosis without current pathological fracture 07/01/2021   Borderline diabetes 07/01/2021   Carpal tunnel syndrome on both sides 07/01/2021   Mixed hyperlipidemia 07/01/2021   Type 2 diabetes mellitus without complication, without long-term current use of insulin  (HCC) 07/01/2021   DDD (degenerative disc disease), lumbar 10/13/2016   GERD (gastroesophageal reflux disease) 06/28/2013   Vitamin B 12 deficiency 06/28/2013   Vitamin D  deficiency 06/28/2013   Nonspecific (abnormal) findings on radiological and other examination of body structure 09/19/2009   ABNORMAL LUNG XRAY 09/19/2009    is allergic to codeine, zofran, and gluten meal.  MEDICAL HISTORY: Past Medical History:  Diagnosis Date   Breast cancer (HCC)    l breast lumpectomy- xrt 1992   Cancer (HCC) right breast   CHEST PAIN 09/19/2009   Qualifier: Diagnosis of  By: Darlean MD, Ozell NOVAK    Diabetes mellitus    diet controlled diabetic   Fatigue 11/04/2011   Headache(784.0)    occasional migraines- takes tylenol    Heart murmur    noted 2 years ago   Personal history of chemotherapy    Personal history of radiation therapy    PONV (postoperative nausea and vomiting)    Ulcer of the stomach and intestine    per h&p Dr Jason 04/02/11    SURGICAL HISTORY: Past Surgical History:  Procedure Laterality Date   APPENDECTOMY     BREAST BIOPSY Right 03/25/2011   BREAST LUMPECTOMY Left 1992   BREAST LUMPECTOMY Right 2012   CARPAL TUNNEL RELEASE     bilateral   MASS EXCISION  05/07/2012   Procedure: EXCISION MASS;  Surgeon: Krystal JINNY Russell, MD;  Location: [facility];  Service: General;  Laterality: Right;  remove right breast mass   PORT-A-CATH REMOVAL  11/20/2011   SHOULDER ARTHROSCOPY     bone spurs    TUBAL LIGATION  SOCIAL HISTORY: Social History   Socioeconomic History   Marital status: Married    Spouse name: Not on file   Number of children: Not on file   Years of education: Not on file   Highest education level: Not on file  Occupational History   Not on file  Tobacco Use   Smoking status: Never   Smokeless tobacco: Never  Substance and Sexual Activity   Alcohol use: No   Drug use: No   Sexual activity: Yes  Other Topics Concern   Not on  file  Social History Narrative   Not on file   Social Drivers of Health   Financial Resource Strain: Low Risk  (11/03/2023)   Received from [facility]   Overall Financial Resource Strain (CARDIA)    Difficulty of Paying Living Expenses: Not very hard  Food Insecurity: No Food Insecurity (11/03/2023)   Received from [facility]   Hunger Vital Sign    Within the past 12 months, you worried that your food would run out before you got the money to buy more.: Never true    Within the past 12 months, the food you bought just didn't last and you didn't have money to get more.: Never true  Transportation Needs: No Transportation Needs (11/03/2023)   Received from [facility]   PRAPARE - Transportation    Lack of Transportation (Medical): No    Lack of Transportation (Non-Medical): No  Physical Activity: Inactive (11/03/2023)   Received from [facility]   Exercise Vital Sign    On average, how many days per week do you engage in moderate to strenuous exercise (like a brisk walk)?: 0 days    On average, how many minutes do you engage in exercise at this level?: 0 min  Stress: No Stress Concern Present (11/03/2023)   Received from [facility] of Occupational Health - Occupational Stress Questionnaire    Feeling of Stress : Only a little  Social Connections: Socially Integrated (11/03/2023)   Received from [facility]   Social Connection and Isolation Panel    In a typical week, how many times do you talk on the phone with family, friends, or neighbors?: More than three times a week    How often do you get together with friends or relatives?: More than three times a week    How often do you attend church or religious services?: More than 4 times per year    Do you belong to any clubs or organizations such as church groups, unions, fraternal or athletic groups, or school groups?: Yes    How often do you attend meetings of the clubs or organizations you  belong to?: More than 4 times per year    Are you married, widowed, divorced, separated, never married, or living with a partner?: Married  Intimate Partner Violence: Not At Risk (11/03/2023)   Received from [facility]   Humiliation, Afraid, Rape, and Kick questionnaire    Within the last year, have you been afraid of your partner or ex-partner?: No    Within the last year, have you been humiliated or emotionally abused in other ways by your partner or ex-partner?: No    Within the last year, have you been kicked, hit, slapped, or otherwise physically hurt by your partner or ex-partner?: No    Within the last year, have you been raped or forced to have any kind of sexual activity by  your partner or ex-partner?: No    FAMILY HISTORY: Family History  Problem Relation Age of Onset   Cancer Mother        cervical   Heart disease Mother    Heart disease Father    Cancer Sister        lung and brain   Cancer Sister 37       breast   Breast cancer Sister    Cancer Sister        colon   Cancer Brother        brain    Review of Systems  Constitutional:  Negative for appetite change, chills, fatigue, fever and unexpected weight change.  HENT:   Negative for hearing loss, lump/mass and trouble swallowing.   Eyes:  Negative for eye problems and icterus.  Respiratory:  Negative for chest tightness, cough and shortness of breath.   Cardiovascular:  Negative for chest pain, leg swelling and palpitations.  Gastrointestinal:  Negative for abdominal distention, abdominal pain, constipation, diarrhea, nausea and vomiting.  Endocrine: Negative for hot flashes.  Genitourinary:  Negative for difficulty urinating.   Musculoskeletal:  Negative for arthralgias.  Skin:  Negative for itching and rash.  Neurological:  Negative for dizziness, extremity weakness, headaches and numbness.  Hematological:  Negative for adenopathy. Does not bruise/bleed easily.  Psychiatric/Behavioral:  Negative for  depression. The patient is not nervous/anxious.       PHYSICAL EXAMINATION   Onc Performance Status - 06/28/24 0839       ECOG Perf Status   ECOG Perf Status Restricted in physically strenuous activity but ambulatory and able to carry out work of a light or sedentary nature, e.g., light house work, office work      KPS SCALE   KPS % SCORE Able to carry on normal activity, minor s/s of disease          Vitals:   06/28/24 0834  BP: 139/67  Pulse: 82  Resp: 16  Temp: 97.6 F (36.4 C)  SpO2: 98%    Physical Exam Constitutional:      General: She is not in acute distress.    Appearance: Normal appearance. She is not toxic-appearing.  HENT:     Head: Normocephalic and atraumatic.     Mouth/Throat:     Mouth: Mucous membranes are moist.     Pharynx: Oropharynx is clear. No oropharyngeal exudate or posterior oropharyngeal erythema.  Eyes:     General: No scleral icterus. Cardiovascular:     Rate and Rhythm: Normal rate and regular rhythm.     Pulses: Normal pulses.     Heart sounds: Normal heart sounds.  Pulmonary:     Effort: Pulmonary effort is normal.     Breath sounds: Normal breath sounds.  Chest:     Comments: Right breast s/p lumpectomy and radiation, nop sign of local recurrence, left breast benign Abdominal:     General: Abdomen is flat. Bowel sounds are normal. There is no distension.     Palpations: Abdomen is soft.     Tenderness: There is no abdominal tenderness.  Musculoskeletal:        General: No swelling.     Cervical back: Neck supple.  Lymphadenopathy:     Cervical: No cervical adenopathy.     Upper Body:     Right upper body: No supraclavicular or axillary adenopathy.     Left upper body: No supraclavicular or axillary adenopathy.  Skin:    General: Skin is warm and  dry.     Findings: No rash.  Neurological:     General: No focal deficit present.     Mental Status: She is alert.  Psychiatric:        Mood and Affect: Mood normal.         Behavior: Behavior normal.        ASSESSMENT and THERAPY PLAN:   Assessment and Plan Assessment & Plan Right breast invasive lobular carcinoma, ER positive, post-treatment, under surveillance Status post lumpectomy, adjuvant chemotherapy, adjuvant radiation, and five years of anastrozole . Continues on observation. Last mammogram showed no malignancy. - Schedule mammogram in September.  Bladder prolapse (cystocele) with associated urinary frequency and mild dysuria Discussed pessary use for bladder support. Referred to gynecologist for further management. - Refer to gynecologist for evaluation of bladder prolapse. - Consider referral to urologist in [place] if needed.    All questions were answered. The patient knows to call the clinic with any problems, questions or concerns. We can certainly see the patient much sooner if necessary.  Total encounter time:20 minutes*in face-to-face visit time, chart review, lab review, care coordination, order entry, and documentation of the encounter time.    Morna Kendall, NP 06/28/24 8:51 AM Medical Oncology and Hematology [facility] [address] [place], [place] [address] Tel. [phone]    Fax. [phone]  *Total Encounter Time as defined by the Centers for Medicare and Medicaid Services includes, in addition to the face-to-face time of a patient visit (documented in the note above) non-face-to-face time: obtaining and reviewing outside history, ordering and reviewing medications, tests or procedures, care coordination (communications with other health care professionals or caregivers) and documentation in the medical record.

## 2024-07-08 ENCOUNTER — Other Ambulatory Visit: Payer: Self-pay

## 2024-07-08 DIAGNOSIS — N8110 Cystocele, unspecified: Secondary | ICD-10-CM

## 2024-08-04 ENCOUNTER — Ambulatory Visit
Admission: RE | Admit: 2024-08-04 | Discharge: 2024-08-04 | Disposition: A | Discharge status: Home / Self Care | Source: Ambulatory Visit | Attending: Internal Medicine | Admitting: Internal Medicine

## 2024-08-04 DIAGNOSIS — Z1231 Encounter for screening mammogram for malignant neoplasm of breast: Secondary | ICD-10-CM

## 2024-10-04 ENCOUNTER — Other Ambulatory Visit: Payer: Self-pay

## 2024-10-04 DIAGNOSIS — N8110 Cystocele, unspecified: Secondary | ICD-10-CM

## 2025-06-28 ENCOUNTER — Encounter: Admitting: Adult Health
